# Patient Record
Sex: Male | Born: 1985 | Race: White | Hispanic: No | Smoking: Never smoker
Health system: Southern US, Community
[De-identification: ages and names within clinical notes are randomized; demographics above are authoritative.]

## PROBLEM LIST (undated history)

## (undated) ENCOUNTER — Emergency Department (HOSPITAL_COMMUNITY): Admission: EM | Discharge status: Absent without leave | Payer: Self-pay | Source: Home / Self Care

## (undated) DIAGNOSIS — K649 Unspecified hemorrhoids: Secondary | ICD-10-CM

## (undated) HISTORY — PX: AMPUTATION: SHX166

---

## 2009-02-20 ENCOUNTER — Emergency Department (HOSPITAL_COMMUNITY): Admission: EM | Admit: 2009-02-20 | Discharge: 2009-02-20 | Payer: Self-pay | Admitting: Emergency Medicine

## 2009-07-27 ENCOUNTER — Emergency Department (HOSPITAL_COMMUNITY): Admission: EM | Admit: 2009-07-27 | Discharge: 2009-07-27 | Payer: Self-pay | Admitting: Emergency Medicine

## 2010-03-17 ENCOUNTER — Emergency Department (HOSPITAL_COMMUNITY): Admission: EM | Admit: 2010-03-17 | Discharge: 2010-03-17 | Payer: Self-pay | Admitting: Emergency Medicine

## 2011-03-22 LAB — URINALYSIS, ROUTINE W REFLEX MICROSCOPIC
Bilirubin Urine: NEGATIVE
Glucose, UA: NEGATIVE mg/dL
Ketones, ur: NEGATIVE mg/dL
Nitrite: NEGATIVE
Protein, ur: NEGATIVE mg/dL
pH: 6.5 (ref 5.0–8.0)

## 2011-04-13 LAB — STREP A DNA PROBE: Group A Strep Probe: NEGATIVE

## 2012-07-19 ENCOUNTER — Encounter (HOSPITAL_COMMUNITY): Payer: Self-pay | Admitting: *Deleted

## 2012-07-19 ENCOUNTER — Emergency Department (HOSPITAL_COMMUNITY)
Admission: EM | Admit: 2012-07-19 | Discharge: 2012-07-19 | Disposition: A | Discharge status: Home / Self Care | Payer: Medicaid Other | Attending: Emergency Medicine | Admitting: Emergency Medicine

## 2012-07-19 DIAGNOSIS — T63461A Toxic effect of venom of wasps, accidental (unintentional), initial encounter: Secondary | ICD-10-CM | POA: Insufficient documentation

## 2012-07-19 DIAGNOSIS — T7840XA Allergy, unspecified, initial encounter: Secondary | ICD-10-CM

## 2012-07-19 DIAGNOSIS — T6391XA Toxic effect of contact with unspecified venomous animal, accidental (unintentional), initial encounter: Secondary | ICD-10-CM | POA: Insufficient documentation

## 2012-07-19 MED ORDER — EPINEPHRINE 0.3 MG/0.3ML IJ DEVI
0.3000 mg | Freq: Once | INTRAMUSCULAR | Status: AC
  Administered 2012-07-19: 0.3 mg via INTRAMUSCULAR
  Filled 2012-07-19: qty 0.3

## 2012-07-19 MED ORDER — PREDNISONE 50 MG PO TABS: ORAL_TABLET | ORAL | Status: AC

## 2012-07-19 MED ORDER — METHYLPREDNISOLONE SODIUM SUCC 125 MG IJ SOLR
125.0000 mg | Freq: Once | INTRAMUSCULAR | Status: AC
  Administered 2012-07-19: 125 mg via INTRAVENOUS
  Filled 2012-07-19: qty 2

## 2012-07-19 MED ORDER — DIPHENHYDRAMINE HCL 50 MG/ML IJ SOLN
25.0000 mg | Freq: Once | INTRAMUSCULAR | Status: AC
  Administered 2012-07-19: 25 mg via INTRAVENOUS
  Filled 2012-07-19: qty 1

## 2012-07-19 MED ORDER — FAMOTIDINE IN NACL 20-0.9 MG/50ML-% IV SOLN
20.0000 mg | Freq: Once | INTRAVENOUS | Status: AC
  Administered 2012-07-19: 20 mg via INTRAVENOUS
  Filled 2012-07-19: qty 50

## 2012-07-19 MED ORDER — EPINEPHRINE 0.3 MG/0.3ML IJ DEVI: 0.3000 mg | INTRAMUSCULAR | Status: DC | PRN

## 2012-07-19 NOTE — ED Notes (Signed)
Swelling noted to face however no swelling to tongue or airway. Denies SOB or chest pain

## 2012-07-19 NOTE — ED Notes (Signed)
Facial swelling decreased no swelling noted to tongue. Still denies CP or SOB

## 2012-07-19 NOTE — ED Provider Notes (Signed)
History  This chart was scribed for Joya Gaskins, MD by Gerlean Ren. This patient was seen in room [facility]16A/[facility]16A and the patient's care was started at 1:17PM.  [name]: [id number]  Arrival date & time 07/19/12  1304   First MD Initiated Contact with Patient 07/19/12 1317      Chief Complaint  Patient presents with  . Insect Bite     Patient is a 26 y.o. male presenting with allergic reaction. The history is provided by the patient. No language interpreter was used.  Allergic Reaction The primary symptoms do not include shortness of breath, vomiting or diarrhea. The current episode started less than 1 hour ago. The problem has been gradually worsening. This is a new problem.  The onset of the reaction was associated with insect bite/sting. Significant symptoms also include itching. Significant symptoms that are not present include eye redness.    Brad Galvan is a 26 y.o. male who presents to the Emergency Department complaining of an allegic reaction to a wasp sting to face with associated rapid onset, gradually worsening, constant face swelling.  Pt also reports mild itching to right arm that has diminished since arrival.  Pt took 50mg  Benadryll with no improvements to symptoms.  Pt has no h/o severe allergies to stings.  Pt reports nose congestion, but no problems with throat or tongue swelling.  Pt denies syncope, fever, neck pain, sore throat, visual disturbance, CP, cough, SOB, abdominal pain, nausea, emesis, diarrhea, urinary symptoms, back pain, HA, weakness, numbness and rash as associated symptoms. He does not have a h/o chronic medical conditions.  Pt denies tobacco and alcohol use.     PMH - none  History reviewed. No pertinent past surgical history.  History reviewed. No pertinent family history.  History  Substance Use Topics  . Smoking status: Never Smoker   . Smokeless tobacco: Not on file  . Alcohol Use: No      Review of Systems  HENT: Positive for facial  swelling. Negative for trouble swallowing.   Eyes: Negative for redness.  Respiratory: Negative for shortness of breath.   Cardiovascular: Negative for chest pain.  Gastrointestinal: Negative for vomiting and diarrhea.  Skin: Positive for itching.  Neurological: Negative for syncope.  All other systems reviewed and are negative.    Allergies  Review of patient's allergies indicates no known allergies.  Home Medications  No current outpatient prescriptions on file.  BP 125/76  Pulse 66  Temp 98.3 F (36.8 C) (Oral)  Ht 5\' 6"  (1.676 m)  Wt 190 lb (86.183 kg)  BMI 30.67 kg/m2  SpO2 98% BP 109/68  Pulse 66  Temp 98.3 F (36.8 C) (Oral)  Resp 18  Ht 5\' 6"  (1.676 m)  Wt 190 lb (86.183 kg)  BMI 30.67 kg/m2  SpO2 98%   Physical Exam  Nursing note and vitals reviewed.  CONSTITUTIONAL: Well developed/well nourished HEAD AND FACE: Normocephalic/atraumatic, periorbital swelling. EYES: EOMI/PERRL ENMT: Mucous membranes moist, No tongue swelling, Uvula midline. NECK: supple no meningeal signs CV: S1/S2 noted, no murmurs/rubs/gallops noted LUNGS: Lungs are clear to auscultation bilaterally, no apparent distress ABDOMEN: soft, nontender, no rebound or guarding, Urticaria present  NEURO: Pt is awake/alert, moves all extremitiesx4 EXTREMITIES: pulses normal, full ROM SKIN: warm, color normal PSYCH: no abnormalities of mood noted  ED Course  Procedures   DIAGNOSTIC STUDIES: Oxygen Saturation is 98% on room air, normal by my interpretation.    COORDINATION OF CARE: 1:22PM- Informed pt that severity of allergic reaction  requires several hours of observation and medication.   2:09PM- Pt re-checked and reports feeling better after epinephrine treatment.   Pt much improved after 3hrs of monitoring He is requesting discharge I advised him of potential of rebound reaction, and when this happens to use epipen that is prescribed and return to ER Pt agreeable     MDM    Nursing notes including past medical history and social history reviewed and considered in documentation   I personally performed the services described in this documentation, which was scribed in my presence. The recorded information has been reviewed and considered.          Joya Gaskins, MD 07/19/12 2035

## 2012-07-19 NOTE — ED Notes (Signed)
Bee sting to face,  Lg am swelling, with hives.

## 2012-08-16 ENCOUNTER — Emergency Department (HOSPITAL_COMMUNITY)
Admission: EM | Admit: 2012-08-16 | Discharge: 2012-08-16 | Disposition: A | Discharge status: Home / Self Care | Payer: Medicaid Other | Attending: Emergency Medicine | Admitting: Emergency Medicine

## 2012-08-16 ENCOUNTER — Encounter (HOSPITAL_COMMUNITY): Payer: Self-pay | Admitting: *Deleted

## 2012-08-16 DIAGNOSIS — T6391XA Toxic effect of contact with unspecified venomous animal, accidental (unintentional), initial encounter: Secondary | ICD-10-CM | POA: Insufficient documentation

## 2012-08-16 DIAGNOSIS — T63481A Toxic effect of venom of other arthropod, accidental (unintentional), initial encounter: Secondary | ICD-10-CM

## 2012-08-16 DIAGNOSIS — T63461A Toxic effect of venom of wasps, accidental (unintentional), initial encounter: Secondary | ICD-10-CM | POA: Insufficient documentation

## 2012-08-16 MED ORDER — PREDNISONE 20 MG PO TABS
60.0000 mg | ORAL_TABLET | Freq: Once | ORAL | Status: AC
  Administered 2012-08-16: 60 mg via ORAL
  Filled 2012-08-16: qty 3

## 2012-08-16 MED ORDER — PREDNISONE 20 MG PO TABS: 40.0000 mg | ORAL_TABLET | Freq: Every day | ORAL | Status: AC

## 2012-08-16 MED ORDER — FAMOTIDINE 20 MG PO TABS
40.0000 mg | ORAL_TABLET | Freq: Once | ORAL | Status: AC
  Administered 2012-08-16: 40 mg via ORAL
  Filled 2012-08-16: qty 2

## 2012-08-16 MED ORDER — EPINEPHRINE 0.3 MG/0.3ML IJ DEVI: 0.3000 mg | INTRAMUSCULAR | Status: DC | PRN

## 2012-08-16 NOTE — ED Notes (Signed)
Bee sting to scalp, used epipen prior to arrival

## 2012-08-16 NOTE — ED Provider Notes (Signed)
History     [name]: [id number]  Arrival date & time 08/16/12  1730   First MD Initiated Contact with Patient 08/16/12 1744      Chief Complaint  Patient presents with  . Allergic Reaction     HPI Pt was seen at 1755.  Per pt, c/o sudden onset and resolution of one episodes of bee sting to his left scalp approx 1630 PTA.  Pt states he has a hx of allergic reaction to same, and used his epi pen as well as took benadryl after being stung.  Denies diffuse rash, no dysphagia, no intra-oral edema, no CP/palpitations, no SOB/wheezing.    History reviewed. No pertinent past medical history.  History reviewed. No pertinent past surgical history.   History  Substance Use Topics  . Smoking status: Never Smoker   . Smokeless tobacco: Not on file  . Alcohol Use: No      Review of Systems ROS: Statement: All systems negative except as marked or noted in the HPI; Constitutional: Negative for fever and chills. ; ; Eyes: Negative for eye pain, redness and discharge. ; ; ENMT: Negative for ear pain, hoarseness, nasal congestion, sinus pressure and sore throat. ; ; Cardiovascular: Negative for chest pain, palpitations, diaphoresis, dyspnea and peripheral edema. ; ; Respiratory: Negative for cough, wheezing and stridor. ; ; Gastrointestinal: Negative for nausea, vomiting, diarrhea, abdominal pain, blood in stool, hematemesis, jaundice and rectal bleeding. . ; ; Genitourinary: Negative for dysuria, flank pain and hematuria. ; ; Musculoskeletal: Negative for back pain and neck pain. Negative for swelling and trauma.; ; Skin: +bee sting. Negative for pruritus, rash, abrasions, blisters, bruising and skin lesion.; ; Neuro: Negative for headache, lightheadedness and neck stiffness. Negative for weakness, altered level of consciousness , altered mental status, extremity weakness, paresthesias, involuntary movement, seizure and syncope.     Allergies  Bee venom  Home Medications   Current Outpatient Rx    Name Route Sig Dispense Refill  . DIPHENHYDRAMINE HCL 25 MG PO TABS Oral Take 50 mg by mouth once as needed. For allergic reaction    . EPINEPHRINE 0.3 MG/0.3ML IJ DEVI Intramuscular Inject 0.3 mLs (0.3 mg total) into the muscle as needed. 2 Device 0    BP 127/76  Pulse 81  Temp 98.1 F (36.7 C) (Oral)  Resp 18  Ht 5\' 6"  (1.676 m)  Wt 189 lb (85.73 kg)  BMI 30.51 kg/m2  SpO2 97%  Physical Exam 1800: Physical examination:  Nursing notes reviewed; Vital signs and O2 SAT reviewed;  Constitutional: Well developed, Well nourished, Well hydrated, In no acute distress; Head:  Normocephalic, atraumatic; Eyes: EOMI, PERRL, No scleral icterus; ENMT: No intra-oral edema, no hoarse voice, no drooling, no stridor. Mouth and pharynx normal, Mucous membranes moist; Neck: Supple, Full range of motion, No lymphadenopathy; Cardiovascular: Regular rate and rhythm, No murmur, rub, or gallop; Respiratory: Breath sounds clear & equal bilaterally, No rales, rhonchi, wheezes.  Speaking full sentences with ease, Normal respiratory effort/excursion; Chest: Nontender, Movement normal; Abdomen: Soft, Nontender, Nondistended, Normal bowel sounds;; Extremities: Pulses normal, No tenderness, No edema, No calf edema or asymmetry.; Neuro: AA&Ox3, Major CN grossly intact.  Speech clear. No gross focal motor or sensory deficits in extremities.; Skin: Color normal, Warm, Dry, no rash.    ED Course  Procedures    MDM  MDM Reviewed: nursing note, previous chart and vitals     2005:  States he continues to feel "fine" while in the ED, approx 4 hours s/p  bee sting/meds.  Wants to go home now, requesting another epi pen rx.  Dx d/w pt and family.  Questions answered.  Verb understanding, agreeable to d/c home with outpt f/u.        Laray Anger, DO 08/17/12 2036

## 2012-08-30 ENCOUNTER — Encounter (HOSPITAL_COMMUNITY): Payer: Self-pay

## 2012-08-30 ENCOUNTER — Emergency Department (HOSPITAL_COMMUNITY): Payer: Medicaid Other

## 2012-08-30 ENCOUNTER — Emergency Department (HOSPITAL_COMMUNITY)
Admission: EM | Admit: 2012-08-30 | Discharge: 2012-08-30 | Disposition: A | Discharge status: Home / Self Care | Payer: Medicaid Other | Attending: Emergency Medicine | Admitting: Emergency Medicine

## 2012-08-30 DIAGNOSIS — S62309A Unspecified fracture of unspecified metacarpal bone, initial encounter for closed fracture: Secondary | ICD-10-CM | POA: Insufficient documentation

## 2012-08-30 DIAGNOSIS — S62304A Unspecified fracture of fourth metacarpal bone, right hand, initial encounter for closed fracture: Secondary | ICD-10-CM

## 2012-08-30 DIAGNOSIS — R0789 Other chest pain: Secondary | ICD-10-CM

## 2012-08-30 DIAGNOSIS — R071 Chest pain on breathing: Secondary | ICD-10-CM | POA: Insufficient documentation

## 2012-08-30 MED ORDER — NAPROXEN 250 MG PO TABS: 250.0000 mg | ORAL_TABLET | Freq: Two times a day (BID) | ORAL | Status: DC

## 2012-08-30 MED ORDER — OXYCODONE-ACETAMINOPHEN 5-325 MG PO TABS: ORAL_TABLET | ORAL | Status: DC

## 2012-08-30 NOTE — ED Notes (Signed)
MD at bedside. 
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
## 2012-09-04 ENCOUNTER — Encounter (HOSPITAL_COMMUNITY): Payer: Self-pay | Admitting: Pharmacy Technician

## 2012-09-04 NOTE — Patient Instructions (Addendum)
Your procedure is scheduled on:  09/06/2012  Report to [facility] at 10:45     AM.  Call this number if you have problems the morning of surgery: [phone]   Remember:   Do not drink or eat food:After Midnight.    Clear liquids include soda, tea, black coffee, apple or grape juice, broth.  Take these medicines the morning of surgery with A SIP OF WATER:    Do not wear jewelry, make-up or nail polish.  Do not wear lotions, powders, or perfumes. You may wear deodorant.  Do not shave 48 hours prior to surgery.  Do not bring valuables to the hospital.  Contacts, dentures or bridgework may not be worn into surgery.  Leave suitcase in the car. After surgery it may be brought to your room.  For patients admitted to the hospital, checkout time is 11:00 AM the day of discharge.   Patients discharged the day of surgery will not be allowed to drive home.  Name and phone number of your driver:   Special Instructions: CHG Shower use special wash: 1/2 bottle night before surgery and 1/2 bottle morning of surgery.   Please read over the following fact sheets that you were given: Pain Booklet, MRSA  Surgical Site Infection Prevention, Anesthesia Post-op Instructions and Care and Recovery After Surgery  Open Reduction, Internal Fixation (ORIF), Generic Usually, if bones are broken (fractured) and are out of place, unstable, or may become out of place, surgery is needed. This surgery is called an open reduction and internal fixation (ORIF). Open reduction means that the area of the fracture is opened up so the surgeon can see it. Internal fixation means that screws, pins, or fixation devices are used to hold the bone pieces in place. LET YOUR CAREGIVER KNOW ABOUT:   Allergies.   Medicines taken, including herbs, eyedrops, over-the-counter medicines, and creams.   Use of steroids (by mouth or creams).   Previous problems with anesthetics or numbing medicines.   History of bleeding or blood problems.     History of blood clots.   Possibility of pregnancy, if this applies.   Previous surgery.   Other health problems.  RISKS AND COMPLICATIONS  All surgery is associated with risks. Some of these risks are:  Excessive bleeding.   Infection.   Imperfect results with loss of joint function.  BEFORE THE PROCEDURE  Usually, surgery is performed shortly after the injury. It is important to provide information to your caregiver after your injury. AFTER THE PROCEDURE  After surgery, you will be taken to a recovery area where a nurse will monitor your progress. You may have a long, narrow tube(catheter) in the bladder following surgery that helps you pass your water. When awake, stable, taking fluids well, and without complications, you will be returned to your room. You will receive physical therapy and other care. Physical therapy is done until you are doing well and your caregiver feels it is safe for you to go home or to an extended care facility. Following surgery, the bones may be protected with a cast. The type of casting depends on where the fracture was. Casts are generally left in place for about 5 to 6 weeks. During this time, your caregiver will follow your progress. X-rays may be taken during healing to make sure the bones stay in place. HOME CARE INSTRUCTIONS   You or your child may resume normal diet and activities as directed or allowed.   Put ice on the injured area.  Put ice in a plastic bag.   Place a towel between the skin and the bag.   Leave the ice on for 15 to 20 minutes at a time, 3 to 4 times a day, for the first 2 days following surgery.   Change bandages (dressings) if necessary or as directed.   If given a plaster or fiberglass cast:   Do not try to scratch the skin under the cast using sharp or pointed objects.   Check the skin around the cast every day. You may put lotion on any red or sore areas.   Keep the cast dry and clean.   Do not put pressure  on any part of the cast or splint until it is fully hardened.   The cast or splint can be protected during bathing with a plastic bag. Do not lower the cast or splint into water.   Only take over-the-counter or prescription medicines for pain, discomfort, or fever as directed by your caregiver.   Use crutches as directed and do not exercise the leg unless instructed.   If the bones get out of position (displaced), it may eventually lead to arthritis and lasting disability. Problems can follow even the best of care. Follow the directions of your caregiver.   Follow all instructions given by your caregiver, make and keep follow-up appointments, and use crutches as directed.  SEEK IMMEDIATE MEDICAL CARE IF:   There is redness, swelling, numbness, or increasing pain in the wound.   There is pus coming from the wound.   You or your child has an oral temperature above 102 F (38.9 C), not controlled by medicine.   A bad smell is coming from the wound or dressing.   The wound breaks open (edges not staying together) after stitches (sutures) or staples have been removed.   The skin or nails below the injury turn blue or gray, or feel cold or numb.   There is severe pain under the cast or in the foot.  If there is not a window in the cast for observing the wound, a discharge or minor bleeding may show up as a stain on the outside of the cast. Report these findings to your caregiver. MAKE SURE YOU:   Understand these instructions.   Will watch your condition.   Will get help right away if you are not doing well or gets worse.  Document Released: 12/24/2006 Document Revised: 12/02/2011 Document Reviewed: 12/01/2007 [facility] Patient Information 2012 [place], [place]. Instructions Following General Anesthetic, Adult A nurse specialized in giving anesthesia (anesthetist) or a doctor specialized in giving anesthesia (anesthesiologist) gave you a medicine that made you sleep while a procedure  was performed. For as long as 24 hours following this procedure, you may feel:  Dizzy.   Weak.   Drowsy.  AFTER THE PROCEDURE After surgery, you will be taken to the recovery area where a nurse will monitor your progress. You will be allowed to go home when you are awake, stable, taking fluids well, and without complications. For the first 24 hours following an anesthetic:  Have a responsible person with you.   Do not drive a car. If you are alone, do not take public transportation.   Do not drink alcohol.   Do not take medicine that has not been prescribed by your caregiver.   Do not sign important papers or make important decisions.   You may resume normal diet and activities as directed.   Change bandages (dressings) as directed.  Only take over-the-counter or prescription medicines for pain, discomfort, or fever as directed by your caregiver.  If you have questions or problems that seem related to the anesthetic, call the hospital and ask for the anesthetist or anesthesiologist on call. SEEK IMMEDIATE MEDICAL CARE IF:   You develop a rash.   You have difficulty breathing.   You have chest pain.   You develop any allergic problems.  Document Released: 03/21/2001 Document Revised: 12/02/2011 Document Reviewed: 10/30/2007 [facility] Patient Information 2012 [place], [place].

## 2012-09-05 ENCOUNTER — Encounter (HOSPITAL_COMMUNITY)
Admission: RE | Admit: 2012-09-05 | Discharge: 2012-09-05 | Disposition: A | Discharge status: Home / Self Care | Payer: Medicaid Other | Source: Ambulatory Visit | Attending: Orthopaedic Surgery | Admitting: Orthopaedic Surgery

## 2012-09-05 ENCOUNTER — Encounter (HOSPITAL_COMMUNITY): Payer: Self-pay | Admitting: Orthopaedic Surgery

## 2012-09-05 ENCOUNTER — Encounter (HOSPITAL_COMMUNITY): Payer: Self-pay

## 2012-09-05 LAB — SURGICAL PCR SCREEN
MRSA, PCR: NEGATIVE
Staphylococcus aureus: NEGATIVE

## 2012-09-05 LAB — URINALYSIS, ROUTINE W REFLEX MICROSCOPIC
Hgb urine dipstick: NEGATIVE
Leukocytes, UA: NEGATIVE
Nitrite: NEGATIVE
Specific Gravity, Urine: >1.03 — ABNORMAL HIGH (ref 1.005–1.030)
Urobilinogen, UA: 0.2 mg/dL (ref 0.0–1.0)

## 2012-09-05 LAB — COMPREHENSIVE METABOLIC PANEL
AST: 19 U/L (ref 0–37)
Albumin: 4 g/dL (ref 3.5–5.2)
CO2: 30 mEq/L (ref 19–32)
Calcium: 9.7 mg/dL (ref 8.4–10.5)
Creatinine, Ser: 0.86 mg/dL (ref 0.50–1.35)
GFR calc non Af Amer: >90 mL/min (ref >90)

## 2012-09-05 LAB — CBC
Platelets: 227 10*3/uL (ref 150–400)
RBC: 5 MIL/uL (ref 4.22–5.81)
WBC: 5.2 10*3/uL (ref 4.0–10.5)

## 2012-09-05 LAB — DIFFERENTIAL
Basophils Absolute: 0 10*3/uL (ref 0.0–0.1)
Basophils Relative: 0 % (ref 0–1)
Eosinophils Relative: 2 % (ref 0–5)
Monocytes Absolute: 0.7 10*3/uL (ref 0.1–1.0)

## 2012-09-05 NOTE — Progress Notes (Signed)
Office notified of need of H& P. Brad Galvan stated that he was working on it.
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
## 2012-09-06 ENCOUNTER — Encounter (HOSPITAL_COMMUNITY)
Admission: RE | Disposition: A | Discharge status: Home / Self Care | Payer: Self-pay | Source: Ambulatory Visit | Attending: Orthopaedic Surgery

## 2012-09-06 ENCOUNTER — Observation Stay (HOSPITAL_COMMUNITY)
Admission: RE | Admit: 2012-09-06 | Discharge: 2012-09-07 | Disposition: A | Discharge status: Home / Self Care | Payer: Medicaid Other | Source: Ambulatory Visit | Attending: Orthopaedic Surgery | Admitting: Orthopaedic Surgery

## 2012-09-06 ENCOUNTER — Ambulatory Visit (HOSPITAL_COMMUNITY): Payer: Medicaid Other

## 2012-09-06 ENCOUNTER — Encounter (HOSPITAL_COMMUNITY): Payer: Self-pay | Admitting: *Deleted

## 2012-09-06 ENCOUNTER — Ambulatory Visit (HOSPITAL_COMMUNITY): Payer: Medicaid Other | Admitting: Anesthesiology

## 2012-09-06 ENCOUNTER — Encounter (HOSPITAL_COMMUNITY): Payer: Self-pay | Admitting: Anesthesiology

## 2012-09-06 DIAGNOSIS — S62309A Unspecified fracture of unspecified metacarpal bone, initial encounter for closed fracture: Principal | ICD-10-CM | POA: Insufficient documentation

## 2012-09-06 DIAGNOSIS — S62304A Unspecified fracture of fourth metacarpal bone, right hand, initial encounter for closed fracture: Secondary | ICD-10-CM

## 2012-09-06 HISTORY — PX: ORIF FINGER FRACTURE: SHX2122

## 2012-09-06 SURGERY — OPEN REDUCTION INTERNAL FIXATION (ORIF) METACARPAL (FINGER) FRACTURE
Anesthesia: General | Site: Hand | Laterality: Right | Wound class: Clean

## 2012-09-06 MED ORDER — MIDAZOLAM HCL 2 MG/2ML IJ SOLN
INTRAMUSCULAR | Status: AC
  Filled 2012-09-06: qty 2

## 2012-09-06 MED ORDER — LIDOCAINE HCL (CARDIAC) 10 MG/ML IV SOLN
INTRAVENOUS | Status: DC | PRN
  Administered 2012-09-06: 50 mg via INTRAVENOUS

## 2012-09-06 MED ORDER — CEFAZOLIN SODIUM 1-5 GM-% IV SOLN
INTRAVENOUS | Status: DC | PRN
  Administered 2012-09-06: 2 g via INTRAVENOUS

## 2012-09-06 MED ORDER — FENTANYL CITRATE 0.05 MG/ML IJ SOLN
INTRAMUSCULAR | Status: AC
  Filled 2012-09-06: qty 2

## 2012-09-06 MED ORDER — ONDANSETRON HCL 4 MG/2ML IJ SOLN
4.0000 mg | Freq: Once | INTRAMUSCULAR | Status: AC
  Administered 2012-09-06: 4 mg via INTRAVENOUS

## 2012-09-06 MED ORDER — MIDAZOLAM HCL 2 MG/2ML IJ SOLN
1.0000 mg | INTRAMUSCULAR | Status: DC | PRN
  Administered 2012-09-06 (×2): 2 mg via INTRAVENOUS

## 2012-09-06 MED ORDER — FENTANYL CITRATE 0.05 MG/ML IJ SOLN
INTRAMUSCULAR | Status: DC | PRN
  Administered 2012-09-06 (×4): 25 ug via INTRAVENOUS
  Administered 2012-09-06 (×3): 50 ug via INTRAVENOUS
  Administered 2012-09-06 (×2): 25 ug via INTRAVENOUS

## 2012-09-06 MED ORDER — ONDANSETRON HCL 4 MG/2ML IJ SOLN
INTRAMUSCULAR | Status: AC
  Filled 2012-09-06: qty 2

## 2012-09-06 MED ORDER — INFLUENZA VIRUS VACC SPLIT PF IM SUSP
0.5000 mL | INTRAMUSCULAR | Status: AC
  Administered 2012-09-07: 0.5 mL via INTRAMUSCULAR
  Filled 2012-09-06: qty 0.5

## 2012-09-06 MED ORDER — PROMETHAZINE HCL 25 MG/ML IJ SOLN
12.5000 mg | INTRAMUSCULAR | Status: DC | PRN
  Administered 2012-09-06: 12.5 mg via INTRAMUSCULAR
  Filled 2012-09-06 (×2): qty 1

## 2012-09-06 MED ORDER — ZOLPIDEM TARTRATE 5 MG PO TABS: 5.0000 mg | ORAL_TABLET | Freq: Every day | ORAL | Status: DC

## 2012-09-06 MED ORDER — PROPOFOL 10 MG/ML IV EMUL
INTRAVENOUS | Status: AC
  Filled 2012-09-06: qty 20

## 2012-09-06 MED ORDER — LACTATED RINGERS IV SOLN
INTRAVENOUS | Status: DC | PRN
  Administered 2012-09-06 (×2): via INTRAVENOUS

## 2012-09-06 MED ORDER — LACTATED RINGERS IV SOLN
INTRAVENOUS | Status: DC
  Administered 2012-09-06: 1000 mL via INTRAVENOUS

## 2012-09-06 MED ORDER — PROPOFOL 10 MG/ML IV BOLUS
INTRAVENOUS | Status: DC | PRN
  Administered 2012-09-06: 300 mg via INTRAVENOUS

## 2012-09-06 MED ORDER — SODIUM CHLORIDE 0.9 % IV SOLN
INTRAVENOUS | Status: DC
  Administered 2012-09-06: 16:00:00 via INTRAVENOUS

## 2012-09-06 MED ORDER — SODIUM CHLORIDE 0.9 % IJ SOLN
INTRAMUSCULAR | Status: AC
  Filled 2012-09-06: qty 3

## 2012-09-06 MED ORDER — CEFAZOLIN SODIUM-DEXTROSE 2-3 GM-% IV SOLR: 2.0000 g | Freq: Once | INTRAVENOUS | Status: DC

## 2012-09-06 MED ORDER — FENTANYL CITRATE 0.05 MG/ML IJ SOLN
25.0000 ug | INTRAMUSCULAR | Status: DC | PRN
  Administered 2012-09-06 (×3): 50 ug via INTRAVENOUS

## 2012-09-06 MED ORDER — ONDANSETRON HCL 4 MG/2ML IJ SOLN: 4.0000 mg | Freq: Once | INTRAMUSCULAR | Status: DC | PRN

## 2012-09-06 MED ORDER — LIDOCAINE HCL (PF) 1 % IJ SOLN
INTRAMUSCULAR | Status: AC
  Filled 2012-09-06: qty 5

## 2012-09-06 MED ORDER — MORPHINE SULFATE 4 MG/ML IJ SOLN
4.0000 mg | INTRAMUSCULAR | Status: DC | PRN
  Administered 2012-09-06 – 2012-09-07 (×3): 4 mg via INTRAVENOUS
  Filled 2012-09-06 (×3): qty 1

## 2012-09-06 MED ORDER — 0.9 % SODIUM CHLORIDE (POUR BTL) OPTIME
TOPICAL | Status: DC | PRN
  Administered 2012-09-06: 1000 mL

## 2012-09-06 MED ORDER — CEFAZOLIN SODIUM-DEXTROSE 2-3 GM-% IV SOLR
INTRAVENOUS | Status: AC
  Filled 2012-09-06: qty 50

## 2012-09-06 MED ORDER — HYDROCODONE-ACETAMINOPHEN 7.5-325 MG PO TABS
1.0000 | ORAL_TABLET | ORAL | Status: DC | PRN
  Administered 2012-09-07: 1 via ORAL
  Filled 2012-09-06 (×2): qty 1

## 2012-09-06 MED ORDER — OXYCODONE-ACETAMINOPHEN 5-325 MG PO TABS: 1.0000 | ORAL_TABLET | ORAL | Status: DC | PRN

## 2012-09-06 SURGICAL SUPPLY — 53 items
BAG HAMPER (MISCELLANEOUS) ×2 IMPLANT
BANDAGE ELASTIC 3 VELCRO NS (GAUZE/BANDAGES/DRESSINGS) ×2 IMPLANT
BANDAGE ESMARK 4X12 BL STRL LF (DISPOSABLE) ×2 IMPLANT
BIT DRILL 1.5 (BIT) ×1
BIT DRILL 1.5MM (BIT) ×1 IMPLANT
BNDG COHESIVE 4X5 TAN NS LF (GAUZE/BANDAGES/DRESSINGS) IMPLANT
BNDG ESMARK 4X12 BLUE STRL LF (DISPOSABLE) ×4
CLOTH BEACON ORANGE TIMEOUT ST (SAFETY) ×2 IMPLANT
COVER LIGHT HANDLE STERIS (MISCELLANEOUS) ×4 IMPLANT
COVER MAYO STAND XLG (DRAPE) ×2 IMPLANT
CUFF TOURNIQUET SINGLE 18IN (TOURNIQUET CUFF) ×2 IMPLANT
CUFF TOURNIQUET SINGLE 34IN LL (TOURNIQUET CUFF) IMPLANT
CUFF TOURNIQUET SINGLE 44IN (TOURNIQUET CUFF) IMPLANT
DRAPE C-ARM FOLDED MOBILE STRL (DRAPES) ×2 IMPLANT
DRILL BIT 1.5MM (BIT) ×1
DURAPREP 26ML APPLICATOR (WOUND CARE) ×2 IMPLANT
GLOVE BIO SURGEON STRL SZ8 (GLOVE) ×2 IMPLANT
GLOVE BIO SURGEON STRL SZ8.5 (GLOVE) ×2 IMPLANT
GOWN STRL REIN XL XLG (GOWN DISPOSABLE) ×8 IMPLANT
KIT ROOM TURNOVER APOR (KITS) ×2 IMPLANT
MANIFOLD NEPTUNE II (INSTRUMENTS) ×2 IMPLANT
MARKER SKIN DUAL TIP RULER LAB (MISCELLANEOUS) ×2 IMPLANT
NS IRRIG 1000ML POUR BTL (IV SOLUTION) ×2 IMPLANT
PACK BASIC LIMB (CUSTOM PROCEDURE TRAY) ×2 IMPLANT
PAD ARMBOARD 7.5X6 YLW CONV (MISCELLANEOUS) ×2 IMPLANT
PAD CAST 4YDX4 CTTN HI CHSV (CAST SUPPLIES) ×1 IMPLANT
PADDING CAST COTTON 4X4 STRL (CAST SUPPLIES) ×1
PLATE 4 HOLE LC DCP (Plate) ×2 IMPLANT
PLATE 6 HOLE LC DCP (Plate) ×2 IMPLANT
SCREW CORT TI ST 2.0X10 (Screw) ×4 IMPLANT
SCREW CORT TI ST 2.0X11 (Screw) ×2 IMPLANT
SCREW CORT TI ST 2.0X12 (Screw) ×6 IMPLANT
SCREW CORT TI ST 2.0X14 (Screw) ×4 IMPLANT
SET BASIN LINEN APH (SET/KITS/TRAYS/PACK) ×2 IMPLANT
SOL PREP PROV IODINE SCRUB 4OZ (MISCELLANEOUS) IMPLANT
SPLINT IMMOBILIZER J 3INX20FT (CAST SUPPLIES)
SPLINT J IMMOBILIZER 3X20FT (CAST SUPPLIES) IMPLANT
SPLINT J IMMOBILIZER 4X20FT (CAST SUPPLIES) ×1 IMPLANT
SPLINT J PLASTER J 4INX20Y (CAST SUPPLIES) ×1
SPONGE GAUZE 4X4 12PLY (GAUZE/BANDAGES/DRESSINGS) ×2 IMPLANT
STAPLER VISISTAT (STAPLE) ×2 IMPLANT
SUT CHROMIC 0 CTX 36 (SUTURE) IMPLANT
SUT CHROMIC VP 2 (SUTURE) ×2 IMPLANT
SUT ETHILON 3 0 FSL (SUTURE) IMPLANT
SUT MON AB 0 CT1 (SUTURE) IMPLANT
SUT MON AB 2-0 SH 27 (SUTURE)
SUT MON AB 2-0 SH27 (SUTURE) IMPLANT
SUT NUROLON CT 2 BLK #1 18IN (SUTURE) IMPLANT
SUT PLAIN 2 0 XLH (SUTURE) ×2 IMPLANT
SUT PLAIN 3 0 FS2 (SUTURE) IMPLANT
SUT SILK 0 FSL (SUTURE) IMPLANT
SYR BULB IRRIGATION 50ML (SYRINGE) ×2 IMPLANT
YANKAUER SUCT 12FT TUBE ARGYLE (SUCTIONS) IMPLANT

## 2012-09-06 NOTE — Anesthesia Procedure Notes (Signed)
Procedure Name: LMA Insertion Date/Time: 09/06/2012 12:29 PM Performed by: Carolyne Littles, AMY L Pre-anesthesia Checklist: Patient identified, Timeout performed, Emergency Drugs available, Suction available and Patient being monitored Patient Re-evaluated:Patient Re-evaluated prior to inductionOxygen Delivery Method: Circle system utilized Preoxygenation: Pre-oxygenation with 100% oxygen Intubation Type: IV induction Ventilation: Mask ventilation without difficulty LMA: LMA inserted LMA Size: 4.0 Number of attempts: 1 Tube secured with: Tape Dental Injury: Teeth and Oropharynx as per pre-operative assessment

## 2012-09-06 NOTE — Transfer of Care (Signed)
Immediate Anesthesia Transfer of Care Note  Patient: Brad Galvan  Procedure(s) Performed: Procedure(s) (LRB) with comments: OPEN REDUCTION INTERNAL FIXATION (ORIF) METACARPAL (FINGER) FRACTURE (Right)  Patient Location: PACU  Anesthesia Type: General  Level of Consciousness: awake, alert , oriented and patient cooperative  Airway & Oxygen Therapy: Patient Spontanous Breathing and Patient connected to face mask oxygen  Post-op Assessment: Report given to PACU RN and Post -op Vital signs reviewed and stable  Post vital signs: Reviewed and stable  Complications: No apparent anesthesia complications

## 2012-09-06 NOTE — Progress Notes (Signed)
The History and Physical is unchanged. I have examined the patient. The patient is medically able to have surgery on the right hand . Brad Galvan

## 2012-09-06 NOTE — Brief Op Note (Signed)
09/06/2012  1:39 PM  PATIENT:  Brad Galvan  26 y.o. male  PRE-OPERATIVE DIAGNOSIS:  fracture right fourth metacarpal  POST-OPERATIVE DIAGNOSIS:  fracture right fourth metacarpal  PROCEDURE:  Procedure(s) (LRB) with comments: OPEN REDUCTION INTERNAL FIXATION (ORIF) METACARPAL (FINGER) FRACTURE (Right)  SURGEON:  Surgeon(s) and Role:    * Darreld Mclean, MD - Primary  PHYSICIAN ASSISTANT:   ASSISTANTS: [place]   ANESTHESIA:   general  EBL:  Total I/O In: 1000 [I.V.:1000] Out: 5 [Blood:5]  BLOOD ADMINISTERED:none  DRAINS: none   LOCAL MEDICATIONS USED:  NONE  SPECIMEN:  No Specimen  DISPOSITION OF SPECIMEN:  N/A  COUNTS:  YES  TOURNIQUET:   Total Tourniquet Time Documented: Upper Arm (Right) - 55 minutes  DICTATION: .Other Dictation: Dictation Number [id number]  PLAN OF CARE: Admit for overnight observation  PATIENT DISPOSITION:  PACU - hemodynamically stable.   Delay start of Pharmacological VTE agent (>24hrs) due to surgical blood loss or risk of bleeding: not applicable

## 2012-09-06 NOTE — Anesthesia Preprocedure Evaluation (Signed)
Anesthesia Evaluation  Patient identified by MRN, date of birth, ID band Patient awake    Reviewed: Allergy & Precautions, H&P , NPO status , Patient's Chart, lab work & pertinent test results  Airway Mallampati: I TM Distance: >3 FB Neck ROM: Full    Dental  (+) Teeth Intact   Pulmonary neg pulmonary ROS,  breath sounds clear to auscultation        Cardiovascular negative cardio ROS  Rhythm:Regular Rate:Normal     Neuro/Psych negative neurological ROS     GI/Hepatic negative GI ROS,   Endo/Other    Renal/GU      Musculoskeletal   Abdominal   Peds  Hematology   Anesthesia Other Findings   Reproductive/Obstetrics                           Anesthesia Physical Anesthesia Plan  ASA: I  Anesthesia Plan: General   Post-op Pain Management:    Induction: Intravenous  Airway Management Planned: LMA  Additional Equipment:   Intra-op Plan:   Post-operative Plan: Extubation in OR  Informed Consent: I have reviewed the patients History and Physical, chart, labs and discussed the procedure including the risks, benefits and alternatives for the proposed anesthesia with the patient or authorized representative who has indicated his/her understanding and acceptance.     Plan Discussed with:   Anesthesia Plan Comments:         Anesthesia Quick Evaluation

## 2012-09-06 NOTE — Preoperative (Signed)
Beta Blockers   Reason not to administer Beta Blockers:Not Applicable 

## 2012-09-06 NOTE — Anesthesia Postprocedure Evaluation (Signed)
  Anesthesia Post-op Note  Patient: Brad Galvan  Procedure(s) Performed: Procedure(s) (LRB) with comments: OPEN REDUCTION INTERNAL FIXATION (ORIF) METACARPAL (FINGER) FRACTURE (Right)  Patient Location: PACU  Anesthesia Type: General  Level of Consciousness: awake, alert , oriented and patient cooperative  Airway and Oxygen Therapy: Patient Spontanous Breathing and Patient connected to face mask oxygen  Post-op Pain: mild  Post-op Assessment: Post-op Vital signs reviewed, Patient's Cardiovascular Status Stable, Respiratory Function Stable, Patent Airway and No signs of Nausea or vomiting  Post-op Vital Signs: Reviewed and stable  Complications: No apparent anesthesia complications
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
## 2012-09-07 NOTE — Addendum Note (Signed)
Addendum  created 09/07/12 0800 by Steaven Wholey J Catlyn Shipton, CRNA   Modules edited:Notes Section    

## 2012-09-07 NOTE — Plan of Care (Signed)
Problem: Discharge Progression Outcomes Goal: Tolerating diet Outcome: Completed/Met Date Met:  09/07/12 No nausea Goal: Other Discharge Outcomes/Goals Outcome: Completed/Met Date Met:  09/07/12 Discharge instructions read to pt and his wife They both verbalized understanding of the instructions.  Pt discharged to home with his wife

## 2012-09-07 NOTE — Discharge Summary (Signed)
Physician Discharge Summary  Patient ID: Brad Galvan MRN: [id number] DOB/AGE: Nov 24, 1986 26 y.o.  Admit date: 09/06/2012 Discharge date: 09/07/2012  Admission Diagnoses: fracture of the right fourth metacarpal  Discharge Diagnoses: fracture of the right fourth metacarpal Active Problems:  * No active hospital problems. *    Discharged Condition: good  Hospital Course: He had surgery with ORIF of the right fourth metacarpal and was admitted to observation post operatively.  He did well with little problem.  He is to go home now with oral pain medicine.  Consults: None  Significant Diagnostic Studies: radiology: X-Ray: placement of plate and screws to the right fourth metacarpal  Treatments: surgery: OTIF to the right fourth metacarpal  Discharge Exam: Blood pressure 118/81, pulse 69, temperature 98.8 F (37.1 C), temperature source Oral, resp. rate 20, height 5\' 6"  (1.676 m), weight 87.1 kg (192 lb 0.3 oz), SpO2 94.00%. General appearance: alert.  His right hand dressing is dry.  NV is intact.  Disposition: 01-Home or Self Care  Discharge Orders    Future Orders Please Complete By Expires   Diet - low sodium heart healthy      Call MD / Call 911      Comments:   If you experience chest pain or shortness of breath, CALL 911 and be transported to the hospital emergency room.  If you develope a fever above 101 F, pus (white drainage) or increased drainage or redness at the wound, or calf pain, call your surgeon's office.   Constipation Prevention      Comments:   Drink plenty of fluids.  Prune juice may be helpful.  You may use a stool softener, such as Colace (over the counter) 100 mg twice a day.  Use MiraLax (over the counter) for constipation as needed.   Increase activity slowly as tolerated      Discharge instructions      Comments:   Keep hand dressing dry.  Elevate as needed.  Do not remove the dressing.  See Dr. Hilda Lias in two weeks. Call if any problem office:  [phone] or hospital after hours [phone].       Medication List     As of 09/07/2012  8:03 AM    TAKE these medications         EPINEPHrine 0.3 mg/0.3 mL Devi   Commonly known as: EPI-PEN   Inject 0.3 mg into the muscle as needed. Allergic Reaction      HYDROcodone-acetaminophen 7.5-325 MG per tablet   Commonly known as: NORCO   Take 1 tablet by mouth every 6 (six) hours as needed. Pain      naproxen 250 MG tablet   Commonly known as: NAPROSYN   Take 250 mg by mouth 2 (two) times daily as needed. Pain         Signed: Darreld Mclean 09/07/2012, 8:03 AM

## 2012-09-07 NOTE — Progress Notes (Signed)
Subjective: 1 Day Post-Op Procedure(s) (LRB): OPEN REDUCTION INTERNAL FIXATION (ORIF) METACARPAL (FINGER) FRACTURE (Right) Patient reports pain as 2 on 0-10 scale.    Objective: Vital signs in last 24 hours: Temp:  [97.7 F (36.5 C)-98.8 F (37.1 C)] 98.8 F (37.1 C) (09/12 0409) Pulse Rate:  [64-87] 69  (09/12 0409) Resp:  [12-25] 20  (09/12 0409) BP: (100-142)/(66-90) 118/81 mmHg (09/12 0409) SpO2:  [92 %-100 %] 94 % (09/12 0409) Weight:  [87.1 kg (192 lb 0.3 oz)] 87.1 kg (192 lb 0.3 oz) (09/11 2221)  Intake/Output from previous day: 09/11 0701 - 09/12 0700 In: 1738.3 [P.O.:240; I.V.:1498.3] Out: 5 [Blood:5] Intake/Output this shift: Total I/O In: 459.2 [I.V.:459.2] Out: -    Basename 09/05/12 0941  HGB 14.9    Basename 09/05/12 0941  WBC 5.2  RBC 5.00  HCT 42.9  PLT 227    Basename 09/05/12 0941  NA 139  K 3.9  CL 102  CO2 30  BUN 16  CREATININE 0.86  GLUCOSE 67*  CALCIUM 9.7   No results found for this basename: LABPT:2,INR:2 in the last 72 hours  Neurologically intact Neurovascular intact Sensation intact distally Intact pulses distally Dorsiflexion/Plantar flexion intact He had a good night.  Pain is controlled. Assessment/Plan: 1 Day Post-Op Procedure(s) (LRB): OPEN REDUCTION INTERNAL FIXATION (ORIF) METACARPAL (FINGER) FRACTURE (Right) Discharge home.  Brad Galvan 09/07/2012, 7:59 AM

## 2012-09-07 NOTE — Progress Notes (Signed)
UR Chart Review Completed  

## 2012-09-07 NOTE — Anesthesia Postprocedure Evaluation (Signed)
  Anesthesia Post-op Note  Patient: Brad Galvan  Procedure(s) Performed: Procedure(s) (LRB) with comments: OPEN REDUCTION INTERNAL FIXATION (ORIF) METACARPAL (FINGER) FRACTURE (Right)  Patient Location: room 204  Anesthesia Type: General  Level of Consciousness: awake and patient cooperative  Airway and Oxygen Therapy: Patient Spontanous Breathing  Post-op Pain: 2 /10, mild  Post-op Assessment: Post-op Vital signs reviewed, Patient's Cardiovascular Status Stable, Respiratory Function Stable, Patent Airway, No signs of Nausea or vomiting and Adequate PO intake  Post-op Vital Signs: Reviewed and stable  Complications: No apparent anesthesia complications

## 2012-09-08 ENCOUNTER — Encounter (HOSPITAL_COMMUNITY): Payer: Self-pay | Admitting: Orthopaedic Surgery

## 2012-11-20 ENCOUNTER — Ambulatory Visit (HOSPITAL_COMMUNITY)
Admission: RE | Admit: 2012-11-20 | Discharge: 2012-11-20 | Disposition: A | Discharge status: Home / Self Care | Payer: Self-pay | Source: Ambulatory Visit | Attending: Orthopaedic Surgery | Admitting: Orthopaedic Surgery

## 2012-11-20 ENCOUNTER — Other Ambulatory Visit (HOSPITAL_COMMUNITY): Payer: Self-pay | Admitting: Orthopaedic Surgery

## 2012-11-20 DIAGNOSIS — S62309A Unspecified fracture of unspecified metacarpal bone, initial encounter for closed fracture: Secondary | ICD-10-CM | POA: Insufficient documentation

## 2012-11-20 DIAGNOSIS — X58XXXA Exposure to other specified factors, initial encounter: Secondary | ICD-10-CM | POA: Insufficient documentation

## 2013-04-23 ENCOUNTER — Emergency Department (HOSPITAL_COMMUNITY): Payer: Medicaid Other

## 2013-04-23 ENCOUNTER — Emergency Department (HOSPITAL_COMMUNITY)
Admission: EM | Admit: 2013-04-23 | Discharge: 2013-04-23 | Disposition: A | Discharge status: Home / Self Care | Payer: Medicaid Other | Attending: Emergency Medicine | Admitting: Emergency Medicine

## 2013-04-23 ENCOUNTER — Encounter (HOSPITAL_COMMUNITY): Payer: Self-pay | Admitting: *Deleted

## 2013-04-23 DIAGNOSIS — S20212A Contusion of left front wall of thorax, initial encounter: Secondary | ICD-10-CM

## 2013-04-23 DIAGNOSIS — Y929 Unspecified place or not applicable: Secondary | ICD-10-CM | POA: Insufficient documentation

## 2013-04-23 DIAGNOSIS — Y9389 Activity, other specified: Secondary | ICD-10-CM | POA: Insufficient documentation

## 2013-04-23 DIAGNOSIS — S20219A Contusion of unspecified front wall of thorax, initial encounter: Secondary | ICD-10-CM | POA: Insufficient documentation

## 2013-04-23 DIAGNOSIS — IMO0002 Reserved for concepts with insufficient information to code with codable children: Secondary | ICD-10-CM | POA: Insufficient documentation

## 2013-04-23 MED ORDER — OXYCODONE-ACETAMINOPHEN 5-325 MG PO TABS
ORAL_TABLET | ORAL | Status: AC
  Administered 2013-04-23: 1 via ORAL
  Filled 2013-04-23: qty 1

## 2013-04-23 MED ORDER — OXYCODONE-ACETAMINOPHEN 5-325 MG PO TABS
1.0000 | ORAL_TABLET | Freq: Once | ORAL | Status: AC
  Administered 2013-04-23: 1 via ORAL

## 2013-04-23 MED ORDER — HYDROCODONE-ACETAMINOPHEN 5-325 MG PO TABS: 1.0000 | ORAL_TABLET | ORAL | Status: DC | PRN
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
## 2013-04-23 NOTE — ED Notes (Signed)
Patient with no complaints at this time. Respirations even and unlabored. Skin warm/dry. Discharge instructions reviewed with patient at this time. Patient given opportunity to voice concerns/ask questions. Patient discharged at this time and left Emergency Department with steady gait.   

## 2013-04-23 NOTE — ED Provider Notes (Signed)
Medical screening examination/treatment/procedure(s) were performed by non-physician practitioner and as supervising physician I was immediately available for consultation/collaboration.   Charles B. Sheldon, MD 04/23/13 2052 
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
## 2013-08-09 ENCOUNTER — Emergency Department (HOSPITAL_COMMUNITY): Payer: Self-pay

## 2013-08-09 ENCOUNTER — Encounter (HOSPITAL_COMMUNITY): Payer: Self-pay

## 2013-08-09 ENCOUNTER — Emergency Department (HOSPITAL_COMMUNITY)
Admission: EM | Admit: 2013-08-09 | Discharge: 2013-08-09 | Disposition: A | Discharge status: Home / Self Care | Payer: Self-pay | Attending: Emergency Medicine | Admitting: Emergency Medicine

## 2013-08-09 DIAGNOSIS — X58XXXA Exposure to other specified factors, initial encounter: Secondary | ICD-10-CM | POA: Insufficient documentation

## 2013-08-09 DIAGNOSIS — S20219A Contusion of unspecified front wall of thorax, initial encounter: Secondary | ICD-10-CM | POA: Insufficient documentation

## 2013-08-09 DIAGNOSIS — Y92838 Other recreation area as the place of occurrence of the external cause: Secondary | ICD-10-CM | POA: Insufficient documentation

## 2013-08-09 DIAGNOSIS — Y9239 Other specified sports and athletic area as the place of occurrence of the external cause: Secondary | ICD-10-CM | POA: Insufficient documentation

## 2013-08-09 DIAGNOSIS — Y9361 Activity, american tackle football: Secondary | ICD-10-CM | POA: Insufficient documentation

## 2013-08-09 DIAGNOSIS — S20211A Contusion of right front wall of thorax, initial encounter: Secondary | ICD-10-CM

## 2013-08-09 MED ORDER — TRAMADOL HCL 50 MG PO TABS: 50.0000 mg | ORAL_TABLET | Freq: Four times a day (QID) | ORAL | Status: DC | PRN

## 2013-08-09 MED ORDER — IBUPROFEN 600 MG PO TABS
600.0000 mg | ORAL_TABLET | Freq: Four times a day (QID) | ORAL | Status: DC | PRN
Start: 2013-08-09 — End: 2015-12-01

## 2013-08-09 MED ORDER — IBUPROFEN 800 MG PO TABS
800.0000 mg | ORAL_TABLET | Freq: Once | ORAL | Status: AC
  Administered 2013-08-09: 800 mg via ORAL
  Filled 2013-08-09: qty 1

## 2013-08-09 NOTE — ED Notes (Signed)
Playing touch football 3 days ago, felt a pull in right upper chest. Now still painfull

## 2013-08-09 NOTE — ED Notes (Signed)
Pain rt upper ant chest for 3 days, after playing football, Hurts to move or cough.  NAD  Feels like "something moving " in chest

## 2013-08-12 NOTE — ED Provider Notes (Signed)
[name]: [id number]     Arrival date & time 08/09/13  2126 History     First MD Initiated Contact with Patient 08/09/13 2143     Chief Complaint  Patient presents with  . Chest Injury   (Consider location/radiation/quality/duration/timing/severity/associated sxs/prior Treatment) HPI Comments: Brad Galvan is a 27 y.o. Male presenting with persistent pain in his right upper chest which started 3 days ago when playing touch football.  He describes landing on his chest during a touch football game and felt a pulling/popping sensation in his chest with persistent pain since which triggered by palpation, stretching and deep inspiration. He denies shortness of breath, cough, fever or chills.  He has had no treatments prior to arrival.      The history is provided by the patient.    No past medical history on file. Past Surgical History  Procedure Laterality Date  . Amputation      right ring fingertip  . Orif finger fracture  09/06/2012    Procedure: OPEN REDUCTION INTERNAL FIXATION (ORIF) METACARPAL (FINGER) FRACTURE;  Surgeon: Darreld Mclean, MD;  Location: [facility];  Service: Orthopedics;  Laterality: Right;   No family history on file. History  Substance Use Topics  . Smoking status: Never Smoker   . Smokeless tobacco: Not on file  . Alcohol Use: No     Comment: occassional    Review of Systems  Constitutional: Negative for fever.  HENT: Negative for congestion, sore throat and neck pain.   Eyes: Negative.   Respiratory: Negative for cough, chest tightness and shortness of breath.   Cardiovascular: Positive for chest pain. Negative for palpitations.  Gastrointestinal: Negative for nausea and abdominal pain.  Genitourinary: Negative.   Musculoskeletal: Negative for joint swelling and arthralgias.  Skin: Negative.  Negative for rash and wound.  Neurological: Negative for dizziness, weakness, light-headedness, numbness and headaches.  Psychiatric/Behavioral: Negative.      Allergies  Bee venom  Home Medications   Current Outpatient Rx  Name  Route  Sig  Dispense  Refill  . EPINEPHrine (EPI-PEN) 0.3 mg/0.3 mL DEVI   Intramuscular   Inject 0.3 mg into the muscle as needed. Allergic Reaction         . ibuprofen (ADVIL,MOTRIN) 600 MG tablet   Oral   Take 1 tablet (600 mg total) by mouth every 6 (six) hours as needed for pain.   30 tablet   0   . traMADol (ULTRAM) 50 MG tablet   Oral   Take 1 tablet (50 mg total) by mouth every 6 (six) hours as needed for pain.   15 tablet   0    BP 125/82  Pulse 87  Temp(Src) 98.8 F (37.1 C) (Oral)  Resp 16  Ht 5\' 6"  (1.676 m)  Wt 182 lb (82.555 kg)  BMI 29.39 kg/m2  SpO2 98% Physical Exam  Nursing note and vitals reviewed. Constitutional: He appears well-developed and well-nourished.  HENT:  Head: Normocephalic and atraumatic.  Eyes: Conjunctivae are normal.  Neck: Normal range of motion.  Cardiovascular: Normal rate, regular rhythm, normal heart sounds and intact distal pulses.   Pulmonary/Chest: Effort normal and breath sounds normal. He has no wheezes. He exhibits tenderness.    Abdominal: Soft. Bowel sounds are normal. There is no tenderness.  Musculoskeletal: Normal range of motion.  Neurological: He is alert.  Skin: Skin is warm and dry.  Psychiatric: He has a normal mood and affect.    ED Course   Procedures (including critical care  time)  Labs Reviewed - No data to display No results found. 1. Chest wall contusion, right, initial encounter     MDM  Patients labs and/or radiological studies were viewed and considered during the medical decision making and disposition process. No rib fx or other bony chest wall injury.  Suspect muscle or cartilage strain. Pain is reproducible.  He was prescribed ibuprofen, tramadol. Encouraged heat application to chest wall.  Prn f/u.  The patient appears reasonably screened and/or stabilized for discharge and I doubt any other medical  condition or other [facility] requiring further screening, evaluation, or treatment in the ED at this time prior to discharge.   Burgess Amor, PA-C 08/12/13 0225

## 2013-08-13 NOTE — ED Provider Notes (Signed)
Medical screening examination/treatment/procedure(s) were performed by non-physician practitioner and as supervising physician I was immediately available for consultation/collaboration.   Joya Gaskins, MD 08/13/13 [phone]

## 2014-12-31 ENCOUNTER — Emergency Department (HOSPITAL_COMMUNITY)
Admission: EM | Admit: 2014-12-31 | Discharge: 2014-12-31 | Disposition: A | Discharge status: Home / Self Care | Payer: Medicaid Other | Attending: Emergency Medicine | Admitting: Emergency Medicine

## 2014-12-31 ENCOUNTER — Encounter (HOSPITAL_COMMUNITY): Payer: Self-pay | Admitting: Emergency Medicine

## 2014-12-31 DIAGNOSIS — S39012A Strain of muscle, fascia and tendon of lower back, initial encounter: Secondary | ICD-10-CM

## 2014-12-31 DIAGNOSIS — Y998 Other external cause status: Secondary | ICD-10-CM | POA: Insufficient documentation

## 2014-12-31 DIAGNOSIS — Y9289 Other specified places as the place of occurrence of the external cause: Secondary | ICD-10-CM | POA: Insufficient documentation

## 2014-12-31 DIAGNOSIS — X58XXXA Exposure to other specified factors, initial encounter: Secondary | ICD-10-CM | POA: Insufficient documentation

## 2014-12-31 DIAGNOSIS — Y9389 Activity, other specified: Secondary | ICD-10-CM | POA: Insufficient documentation

## 2014-12-31 MED ORDER — METHOCARBAMOL 500 MG PO TABS: 1000.0000 mg | ORAL_TABLET | Freq: Three times a day (TID) | ORAL | Status: DC

## 2014-12-31 MED ORDER — CELECOXIB 100 MG PO CAPS
100.0000 mg | ORAL_CAPSULE | Freq: Two times a day (BID) | ORAL | Status: DC
Start: 2014-12-31 — End: 2015-12-01

## 2014-12-31 NOTE — ED Provider Notes (Signed)
[name]: [id number]     Arrival date & time 12/31/14  0757 History   First MD Initiated Contact with Patient 12/31/14 [phone]     Chief Complaint  Patient presents with  . Back Pain     (Consider location/radiation/quality/duration/timing/severity/associated sxs/prior Treatment) Patient is a 29 y.o. male presenting with back pain. The history is provided by the patient.  Back Pain Location:  Lumbar spine Quality:  Stabbing Radiates to:  Does not radiate Pain severity:  Moderate Pain is:  Same all the time Onset quality:  Sudden Duration:  2 days Timing:  Intermittent Progression:  Unchanged Chronicity:  New Context: lifting heavy objects   Relieved by:  Bed rest Worsened by:  Bending Ineffective treatments:  Heating pad Associated symptoms: no abdominal pain, no bladder incontinence, no bowel incontinence, no chest pain, no dysuria and no perianal numbness     History reviewed. No pertinent past medical history. Past Surgical History  Procedure Laterality Date  . Amputation      right ring fingertip  . Orif finger fracture  09/06/2012    Procedure: OPEN REDUCTION INTERNAL FIXATION (ORIF) METACARPAL (FINGER) FRACTURE;  Surgeon: Darreld McleanWayne Keeling, MD;  Location: [facility];  Service: Orthopedics;  Laterality: Right;   History reviewed. No pertinent family history. History  Substance Use Topics  . Smoking status: Never Smoker   . Smokeless tobacco: Never Used  . Alcohol Use: Yes     Comment: occassional    Review of Systems  Constitutional: Negative for activity change.       All ROS Neg except as noted in HPI  HENT: Negative for nosebleeds.   Eyes: Negative for photophobia and discharge.  Respiratory: Negative for cough, shortness of breath and wheezing.   Cardiovascular: Negative for chest pain and palpitations.  Gastrointestinal: Negative for abdominal pain, blood in stool and bowel incontinence.  Genitourinary: Negative for bladder incontinence, dysuria, frequency and hematuria.   Musculoskeletal: Positive for back pain. Negative for arthralgias and neck pain.  Skin: Negative.   Neurological: Negative for dizziness, seizures and speech difficulty.  Psychiatric/Behavioral: Negative for hallucinations and confusion.      Allergies  Bee venom  Home Medications   Prior to Admission medications   Medication Sig Start Date End Date Taking? Authorizing Provider  EPINEPHrine (EPI-PEN) 0.3 mg/0.3 mL DEVI Inject 0.3 mg into the muscle as needed. Allergic Reaction 08/16/12   Samuel JesterKathleen McManus, DO  ibuprofen (ADVIL,MOTRIN) 600 MG tablet Take 1 tablet (600 mg total) by mouth every 6 (six) hours as needed for pain. 08/09/13   Burgess AmorJulie Idol, PA-C  traMADol (ULTRAM) 50 MG tablet Take 1 tablet (50 mg total) by mouth every 6 (six) hours as needed for pain. 08/09/13   Burgess AmorJulie Idol, PA-C   BP 125/89 mmHg  Pulse 85  Temp(Src) 97.8 F (36.6 C) (Oral)  Resp 16  Ht 5\' 7"  (1.702 m)  Wt 180 lb (81.647 kg)  BMI 28.19 kg/m2  SpO2 100% Physical Exam  Constitutional: He is oriented to person, place, and time. He appears well-developed and well-nourished.  Non-toxic appearance.  HENT:  Head: Normocephalic.  Right Ear: Tympanic membrane and external ear normal.  Left Ear: Tympanic membrane and external ear normal.  Eyes: EOM and lids are normal. Pupils are equal, round, and reactive to light.  Neck: Normal range of motion. Neck supple. Carotid bruit is not present.  Cardiovascular: Normal rate, regular rhythm, normal heart sounds, intact distal pulses and normal pulses.   Pulmonary/Chest: Breath sounds normal. No respiratory distress.  Abdominal: Soft. Bowel sounds are normal. There is no tenderness. There is no guarding.  Musculoskeletal:       Lumbar back: He exhibits decreased range of motion, tenderness and spasm.  Lymphadenopathy:       Head (right side): No submandibular adenopathy present.       Head (left side): No submandibular adenopathy present.    He has no cervical  adenopathy.  Neurological: He is alert and oriented to person, place, and time. He has normal strength. No cranial nerve deficit or sensory deficit.  Skin: Skin is warm and dry.  Psychiatric: He has a normal mood and affect. His speech is normal.  Nursing note and vitals reviewed.   ED Course  Procedures (including critical care time) Labs Review Labs Reviewed - No data to display  Imaging Review No results found.   EKG Interpretation None      MDM Exam suggest lumbar strain. No gross neuro deficit noted. Pt ambulatory. Vital signs stable. Rx for celebrex and robaxin given to the patient.   Final diagnoses:  Lumbar strain, initial encounter    *I have reviewed nursing notes, vital signs, and all appropriate lab and imaging results for this patient.[address] Garry Heater, PA-C 01/03/15 1353  Donnetta Hutching, MD 01/04/15 [phone]

## 2014-12-31 NOTE — ED Notes (Signed)
Pt reports helped move a wood stove Sunday and Monday when he got out of bed, felt a pop in lower back.  C/O pain in r lower back since then.  Describes as sharp.  Denies urinary symptoms.

## 2014-12-31 NOTE — ED Notes (Signed)
Patient c/o mid right back pain that started yesterday morning. Per patient no known injury but reports he did help move a wood stove Sunday with no problems. Denies any urinary symptoms. Per patient pain worse with bending. Took tylenol yesterday with no relief.

## 2014-12-31 NOTE — Discharge Instructions (Signed)

## 2015-12-01 ENCOUNTER — Encounter (HOSPITAL_COMMUNITY): Payer: Self-pay | Admitting: Emergency Medicine

## 2015-12-01 ENCOUNTER — Emergency Department (HOSPITAL_COMMUNITY)
Admission: EM | Admit: 2015-12-01 | Discharge: 2015-12-01 | Disposition: A | Discharge status: Home / Self Care | Payer: Medicaid Other | Attending: Emergency Medicine | Admitting: Emergency Medicine

## 2015-12-01 DIAGNOSIS — K644 Residual hemorrhoidal skin tags: Secondary | ICD-10-CM

## 2015-12-01 DIAGNOSIS — Z8781 Personal history of (healed) traumatic fracture: Secondary | ICD-10-CM | POA: Insufficient documentation

## 2015-12-01 MED ORDER — HYDROCODONE-ACETAMINOPHEN 5-325 MG PO TABS: 2.0000 | ORAL_TABLET | ORAL | Status: DC | PRN

## 2015-12-01 MED ORDER — HYDROCORTISONE 2.5 % RE CREA: TOPICAL_CREAM | RECTAL | Status: DC

## 2015-12-01 MED ORDER — DOCUSATE SODIUM 100 MG PO CAPS: 100.0000 mg | ORAL_CAPSULE | Freq: Two times a day (BID) | ORAL | Status: DC

## 2015-12-01 NOTE — ED Notes (Signed)
Patient states small amount of bright red bleeding from rectum today. States h/o hemorrhoids. Tried A&D ointment with small amount of pain relief.

## 2015-12-01 NOTE — ED Provider Notes (Signed)
[name]: [id number]     Arrival date & time 12/01/15  8295 History  By signing my name below, I, Gwenyth Ober, attest that this documentation has been prepared under the direction and in the presence of Rolland Porter, MD.  Electronically Signed: Gwenyth Ober, ED Scribe. 12/01/2015. 9:51 AM.  Chief Complaint  Patient presents with  . Rectal Bleeding   The history is provided by the patient. No language interpreter was used.   HPI Comments: Brad Galvan is a 29 y.o. male who presents to the Emergency Department complaining of constant, moderate rectal pain that started yesterday. He states mild rectal bleeding that occurs with wiping and started after the pain as an associated symptom. Pt reports feeling a lump around his anus. He tried applying A&D ointment with minimal relief. Pt works in [employer] and is required to do heavy lifting. He did not work yesterday. Pt has a history of rectal bleeding which was evaluated by his PCP and attributed to constipation. He denies current constipation.  History reviewed. No pertinent past medical history. Past Surgical History  Procedure Laterality Date  . Amputation      right ring fingertip  . Orif finger fracture  09/06/2012    Procedure: OPEN REDUCTION INTERNAL FIXATION (ORIF) METACARPAL (FINGER) FRACTURE;  Surgeon: Darreld Mclean, MD;  Location: [facility];  Service: Orthopedics;  Laterality: Right;   No family history on file. Social History  Substance Use Topics  . Smoking status: Never Smoker   . Smokeless tobacco: Never Used  . Alcohol Use: Yes     Comment: occassional    Review of Systems  Constitutional: Negative for fever, chills, diaphoresis, appetite change and fatigue.  HENT: Negative for mouth sores, sore throat and trouble swallowing.   Eyes: Negative for visual disturbance.  Respiratory: Negative for cough, chest tightness, shortness of breath and wheezing.   Cardiovascular: Negative for chest pain.  Gastrointestinal: Positive  for anal bleeding and rectal pain. Negative for nausea, vomiting, abdominal pain, diarrhea, constipation and abdominal distention.  Endocrine: Negative for polydipsia, polyphagia and polyuria.  Genitourinary: Negative for dysuria, frequency and hematuria.  Musculoskeletal: Negative for gait problem.  Skin: Negative for color change, pallor and rash.  Neurological: Negative for dizziness, syncope, light-headedness and headaches.  Hematological: Does not bruise/bleed easily.  Psychiatric/Behavioral: Negative for behavioral problems and confusion.   Allergies  Bee venom  Home Medications   Prior to Admission medications   Medication Sig Start Date End Date Taking? Authorizing Provider  EPINEPHrine (EPI-PEN) 0.3 mg/0.3 mL DEVI Inject 0.3 mg into the muscle as needed. Allergic Reaction 08/16/12  Yes Samuel Jester, DO  docusate sodium (COLACE) 100 MG capsule Take 1 capsule (100 mg total) by mouth every 12 (twelve) hours. 12/01/15   Rolland Porter, MD  HYDROcodone-acetaminophen (NORCO/VICODIN) 5-325 MG tablet Take 2 tablets by mouth every 4 (four) hours as needed. 12/01/15   Rolland Porter, MD  hydrocortisone (ANUSOL-HC) 2.5 % rectal cream Apply rectally 2 times daily 12/01/15   Rolland Porter, MD   BP 120/90 mmHg  Pulse 61  Temp(Src) 98.4 F (36.9 C) (Oral)  Resp 18  Ht  (1.702 m)  Wt 190 lb (86.183 kg)  BMI 29.75 kg/m2  SpO2 100% Physical Exam  Constitutional: He is oriented to person, place, and time. He appears well-developed and well-nourished. No distress.  HENT:  Head: Normocephalic.  Eyes: Conjunctivae are normal. Pupils are equal, round, and reactive to light. No scleral icterus.  Neck: Normal range of motion. Neck supple.  No thyromegaly present.  Cardiovascular: Normal rate and regular rhythm.  Exam reveals no gallop and no friction rub.   No murmur heard. Pulmonary/Chest: Effort normal and breath sounds normal. No respiratory distress. He has no wheezes. He has no rales.   Abdominal: Soft. Bowel sounds are normal. He exhibits no distension. There is no tenderness. There is no rebound.  Genitourinary:  2-3 o'clock position: 1 cm hemorrhoid with small ulcerated area  Musculoskeletal: Normal range of motion.  Neurological: He is alert and oriented to person, place, and time.  Skin: Skin is warm and dry. No rash noted.  Psychiatric: He has a normal mood and affect. His behavior is normal.  Nursing note and vitals reviewed.   ED Course  Procedures  DIAGNOSTIC STUDIES: Oxygen Saturation is 100% on RA, normal by my interpretation.    COORDINATION OF CARE: 10:01 AM Discussed treatment plan with pt at bedside and pt agreed to plan.  Labs Review Labs Reviewed - No data to display  Imaging Review No results found.   EKG Interpretation None      MDM   Final diagnoses:  External hemorrhoid  I personally performed the services described in this documentation, which was scribed in my presence. The recorded information has been reviewed and is accurate.    Rolland PorterMark Bronco, MD 12/03/15 [phone]

## 2015-12-01 NOTE — Discharge Instructions (Signed)
Hemorrhoids °Hemorrhoids are swollen veins around the rectum or anus. There are two types of hemorrhoids:  °· Internal hemorrhoids. These occur in the veins just inside the rectum. They may poke through to the outside and become irritated and painful. °· External hemorrhoids. These occur in the veins outside the anus and can be felt as a painful swelling or hard lump near the anus. °CAUSES °· Pregnancy.   °· Obesity.   °· Constipation or diarrhea.   °· Straining to have a bowel movement.   °· Sitting for long periods on the toilet. °· Heavy lifting or other activity that caused you to strain. °· Anal intercourse. °SYMPTOMS  °· Pain.   °· Anal itching or irritation.   °· Rectal bleeding.   °· Fecal leakage.   °· Anal swelling.   °· One or more lumps around the anus.   °DIAGNOSIS  °Your caregiver may be able to diagnose hemorrhoids by visual examination. Other examinations or tests that may be performed include:  °· Examination of the rectal area with a gloved hand (digital rectal exam).   °· Examination of anal canal using a small tube (scope).   °· A blood test if you have lost a significant amount of blood. °· A test to look inside the colon (sigmoidoscopy or colonoscopy). °TREATMENT °Most hemorrhoids can be treated at home. However, if symptoms do not seem to be getting better or if you have a lot of rectal bleeding, your caregiver may perform a procedure to help make the hemorrhoids get smaller or remove them completely. Possible treatments include:  °· Placing a rubber band at the base of the hemorrhoid to cut off the circulation (rubber band ligation).   °· Injecting a chemical to shrink the hemorrhoid (sclerotherapy).   °· Using a tool to burn the hemorrhoid (infrared light therapy).   °· Surgically removing the hemorrhoid (hemorrhoidectomy).   °· Stapling the hemorrhoid to block blood flow to the tissue (hemorrhoid stapling).   °HOME CARE INSTRUCTIONS  °· Eat foods with fiber, such as whole grains, beans,  nuts, fruits, and vegetables. Ask your doctor about taking products with added fiber in them (fiber supplements). °· Increase fluid intake. Drink enough water and fluids to keep your urine clear or pale yellow.   °· Exercise regularly.   °· Go to the bathroom when you have the urge to have a bowel movement. Do not wait.   °· Avoid straining to have bowel movements.   °· Keep the anal area dry and clean. Use wet toilet paper or moist towelettes after a bowel movement.   °· Medicated creams and suppositories may be used or applied as directed.   °· Only take over-the-counter or prescription medicines as directed by your caregiver.   °· Take warm sitz baths for 15-20 minutes, 3-4 times a day to ease pain and discomfort.   °· Place ice packs on the hemorrhoids if they are tender and swollen. Using ice packs between sitz baths may be helpful.   °· Put ice in a plastic bag.   °· Place a towel between your skin and the bag.   °· Leave the ice on for 15-20 minutes, 3-4 times a day.   °· Do not use a donut-shaped pillow or sit on the toilet for long periods. This increases blood pooling and pain.   °SEEK MEDICAL CARE IF: °· You have increasing pain and swelling that is not controlled by treatment or medicine. °· You have uncontrolled bleeding. °· You have difficulty or you are unable to have a bowel movement. °· You have pain or inflammation outside the area of the hemorrhoids. °MAKE SURE YOU: °· Understand these instructions. °·   Will watch your condition. °· Will get help right away if you are not doing well or get worse. °  °This information is not intended to replace advice given to you by your health care provider. Make sure you discuss any questions you have with your health care provider. °  °Document Released: 12/10/2000 Document Revised: 11/29/2012 Document Reviewed: 10/17/2012 °Elsevier Interactive Patient Education ©2016 [employer] ° °High-Fiber Diet °Fiber, also called dietary fiber, is a type of carbohydrate  found in fruits, vegetables, whole grains, and beans. A high-fiber diet can have many health benefits. Your health care provider may recommend a high-fiber diet to help: °· Prevent constipation. Fiber can make your bowel movements more regular. °· Lower your cholesterol. °· Relieve hemorrhoids, uncomplicated diverticulosis, or irritable bowel syndrome. °· Prevent overeating as part of a weight-loss plan. °· Prevent heart disease, type 2 diabetes, and certain cancers. °WHAT IS MY PLAN? °The recommended daily intake of fiber includes: °· 38 grams for men under age 50. °· 30 grams for men over age 50. °· 25 grams for women under age 50. °· 21 grams for women over age 50. °You can get the recommended daily intake of dietary fiber by eating a variety of fruits, vegetables, grains, and beans. Your health care provider may also recommend a fiber supplement if it is not possible to get enough fiber through your diet. °WHAT DO I NEED TO KNOW ABOUT A HIGH-FIBER DIET? °· Fiber supplements have not been widely studied for their effectiveness, so it is better to get fiber through food sources. °· Always check the fiber content on the nutrition facts label of any prepackaged food. Look for foods that contain at least 5 grams of fiber per serving. °· Ask your dietitian if you have questions about specific foods that are related to your condition, especially if those foods are not listed in the following section. °· Increase your daily fiber consumption gradually. Increasing your intake of dietary fiber too quickly may cause bloating, cramping, or gas. °· Drink plenty of water. Water helps you to digest fiber. °WHAT FOODS CAN I EAT? °Grains °Whole-grain breads. Multigrain cereal. Oats and oatmeal. Brown rice. Barley. Bulgur wheat. Millet. Bran muffins. Popcorn. Rye wafer crackers. °Vegetables °Sweet potatoes. Spinach. Kale. Artichokes. Cabbage. Broccoli. Green peas. Carrots. Squash. °Fruits °Berries. Pears. Apples. Oranges.  Avocados. Prunes and raisins. Dried figs. °Meats and Other Protein Sources °Navy, kidney, pinto, and soy beans. Split peas. Lentils. Nuts and seeds. °Dairy °Fiber-fortified yogurt. °Beverages °Fiber-fortified soy milk. Fiber-fortified orange juice. °Other °Fiber bars. °The items listed above may not be a complete list of recommended foods or beverages. Contact your dietitian for more options. °WHAT FOODS ARE NOT RECOMMENDED? °Grains °White bread. Pasta made with refined flour. White rice. °Vegetables °Fried potatoes. Canned vegetables. Well-cooked vegetables.  °Fruits °Fruit juice. Cooked, strained fruit. °Meats and Other Protein Sources °Fatty cuts of meat. Fried poultry or fried fish. °Dairy °Milk. Yogurt. Cream cheese. Sour cream. °Beverages °Soft drinks. °Other °Cakes and pastries. Butter and oils. °The items listed above may not be a complete list of foods and beverages to avoid. Contact your dietitian for more information. °WHAT ARE SOME TIPS FOR INCLUDING HIGH-FIBER FOODS IN MY DIET? °· Eat a wide variety of high-fiber foods. °· Make sure that half of all grains consumed each day are whole grains. °· Replace breads and cereals made from refined flour or white flour with whole-grain breads and cereals. °· Replace white rice with brown rice, bulgur wheat, or millet. °· Start   the day with a breakfast that is high in fiber, such as a cereal that contains at least 5 grams of fiber per serving. °· Use beans in place of meat in soups, salads, or pasta. °· Eat high-fiber snacks, such as berries, raw vegetables, nuts, or popcorn. °  °This information is not intended to replace advice given to you by your health care provider. Make sure you discuss any questions you have with your health care provider. °  °Document Released: 12/13/2005 Document Revised: 01/03/2015 Document Reviewed: 05/28/2014 °Elsevier Interactive Patient Education ©2016 [employer] ° °Nonsurgical Procedures for Hemorrhoids °Nonsurgical procedures  can be used to treat hemorrhoids. Hemorrhoids are swollen veins that are inside the rectum (internal hemorrhoids) or around the anus (external hemorrhoids). They are caused by increased pressure in the anal area. This pressure may result from straining to have a bowel movement (constipation), diarrhea, pregnancy, obesity, anal sex, or sitting for long periods of time. °Hemorrhoids can cause symptoms such as pain and bleeding. Various procedures may be performed if diet changes, lifestyle changes, and other treatments do not help your symptoms. Some of these procedures do not involve surgery. Three common nonsurgical procedures are: °· Rubber band ligation. Rubber bands are used to cut off the blood supply to the hemorrhoids. °· Sclerotherapy. Medicine is injected into the hemorrhoids to shrink them. °· Infrared coagulation. A type of light energy is used to get rid of the hemorrhoids. °LET YOUR HEALTH CARE PROVIDER KNOW ABOUT: °· Any allergies you have. °· All medicines you are taking, including vitamins, herbs, eye drops, creams, and over-the-counter medicines. °· Previous problems you or members of your family have had with the use of anesthetics. °· Any blood disorders you have. °· Previous surgeries you have had. °· Any medical conditions you have. °· Whether you are pregnant or may be pregnant. °RISKS AND COMPLICATIONS °Generally, this is a safe procedure. However, problems may occur, including: °· Infection. °· Bleeding. °· Pain. °BEFORE THE PROCEDURE °· Ask your health care provider about: °¨ Changing or stopping your regular medicines. This is especially important if you are taking diabetes medicines or blood thinners. °¨ Taking medicines such as aspirin and ibuprofen. These medicines can thin your blood. Do not take these medicines before your procedure if your health care provider instructs you not to. °· You may need to have a procedure to examine the inside of your colon with a scope (colonoscopy). Your  health care provider may do this to make sure that there are no other causes for your bleeding or pain. °PROCEDURE °· Your health care provider will clean your rectal area with a rinsing solution. °· A lubricating jelly may be placed into your rectum. The jelly may contain a medicine to numb the area (local anesthetic). °· Your health care provider will insert a short scope (anoscope) into your rectum to examine the hemorrhoids. °· One of the following techniques will be used. °Rubber Band Ligation °Your health care provider will place medical instruments through the scope to put rubber bands around the base of your hemorrhoids. The bands will cut off the blood supply to the hemorrhoids. The hemorrhoids will fall off after several days. °Sclerotherapy °Your health care provider will inject medicine through the scope into your hemorrhoids. This will cause them to shrink and dry up. °Infrared Coagulation °Your health care provider will shine a type of light through the scope onto your hemorrhoids. This light will generate energy (infrared radiation). It will cause the hemorrhoids to scar   and then fall off. °Each of these procedures may vary among health care providers and hospitals. °AFTER THE PROCEDURE °· You will be monitored to make sure that you have no bleeding. °· Return to your normal activities as told by your health care provider. °  °This information is not intended to replace advice given to you by your health care provider. Make sure you discuss any questions you have with your health care provider. °  °Document Released: 10/10/2009 Document Revised: 09/03/2015 Document Reviewed: 03/10/2015 °Elsevier Interactive Patient Education ©2016 [employer]. ° °

## 2015-12-01 NOTE — ED Notes (Signed)
Patient with no complaints at this time. Respirations even and unlabored. Skin warm/dry. Discharge instructions reviewed with patient at this time. Patient given opportunity to voice concerns/ask questions. Patient discharged at this time and left Emergency Department with steady gait.   

## 2015-12-01 NOTE — ED Notes (Signed)
PT c/o small amount of red blood noted from rectum starting yesterday and states some relief with A&D ointment.

## 2015-12-17 ENCOUNTER — Emergency Department (HOSPITAL_COMMUNITY)
Admission: EM | Admit: 2015-12-17 | Discharge: 2015-12-17 | Disposition: A | Discharge status: Home / Self Care | Payer: Medicaid Other | Attending: Emergency Medicine | Admitting: Emergency Medicine

## 2015-12-17 ENCOUNTER — Encounter (HOSPITAL_COMMUNITY): Payer: Self-pay | Admitting: *Deleted

## 2015-12-17 DIAGNOSIS — R197 Diarrhea, unspecified: Secondary | ICD-10-CM | POA: Insufficient documentation

## 2015-12-17 DIAGNOSIS — Z7952 Long term (current) use of systemic steroids: Secondary | ICD-10-CM | POA: Insufficient documentation

## 2015-12-17 DIAGNOSIS — R112 Nausea with vomiting, unspecified: Secondary | ICD-10-CM | POA: Insufficient documentation

## 2015-12-17 LAB — CBC WITH DIFFERENTIAL/PLATELET
BASOS ABS: 0 10*3/uL (ref 0.0–0.1)
BASOS PCT: 0 %
Eosinophils Absolute: 0.1 10*3/uL (ref 0.0–0.7)
Eosinophils Relative: 1 %
HEMATOCRIT: 47.1 % (ref 39.0–52.0)
Hemoglobin: 16.6 g/dL (ref 13.0–17.0)
Lymphocytes Relative: 6 %
Lymphs Abs: 0.8 10*3/uL (ref 0.7–4.0)
MCH: 31.1 pg (ref 26.0–34.0)
MCHC: 35.2 g/dL (ref 30.0–36.0)
MCV: 88.2 fL (ref 78.0–100.0)
MONO ABS: 1 10*3/uL (ref 0.1–1.0)
Monocytes Relative: 7 %
NEUTROS ABS: 12 10*3/uL — AB (ref 1.7–7.7)
NEUTROS PCT: 86 %
Platelets: 244 10*3/uL (ref 150–400)
RBC: 5.34 MIL/uL (ref 4.22–5.81)
RDW: 12.6 % (ref 11.5–15.5)
WBC: 13.9 10*3/uL — ABNORMAL HIGH (ref 4.0–10.5)

## 2015-12-17 LAB — LIPASE, BLOOD: LIPASE: 20 U/L (ref 11–51)

## 2015-12-17 LAB — COMPREHENSIVE METABOLIC PANEL
ALT: 40 U/L (ref 17–63)
ANION GAP: 6 (ref 5–15)
AST: 32 U/L (ref 15–41)
Albumin: 4.6 g/dL (ref 3.5–5.0)
Alkaline Phosphatase: 56 U/L (ref 38–126)
BILIRUBIN TOTAL: 0.4 mg/dL (ref 0.3–1.2)
BUN: 18 mg/dL (ref 6–20)
CO2: 28 mmol/L (ref 22–32)
Calcium: 9.4 mg/dL (ref 8.9–10.3)
Chloride: 101 mmol/L (ref 101–111)
Creatinine, Ser: 1.03 mg/dL (ref 0.61–1.24)
GFR calc Af Amer: >60 mL/min (ref >60)
Glucose, Bld: 115 mg/dL — ABNORMAL HIGH (ref 65–99)
POTASSIUM: 4.5 mmol/L (ref 3.5–5.1)
Sodium: 135 mmol/L (ref 135–145)
TOTAL PROTEIN: 8 g/dL (ref 6.5–8.1)

## 2015-12-17 MED ORDER — SODIUM CHLORIDE 0.9 % IV SOLN
1000.0000 mL | Freq: Once | INTRAVENOUS | Status: AC
  Administered 2015-12-17: 1000 mL via INTRAVENOUS

## 2015-12-17 MED ORDER — SODIUM CHLORIDE 0.9 % IV SOLN: 1000.0000 mL | INTRAVENOUS | Status: DC

## 2015-12-17 MED ORDER — ONDANSETRON HCL 4 MG/2ML IJ SOLN
4.0000 mg | Freq: Once | INTRAMUSCULAR | Status: AC
  Administered 2015-12-17: 4 mg via INTRAVENOUS
  Filled 2015-12-17: qty 2

## 2015-12-17 MED ORDER — ONDANSETRON HCL 4 MG PO TABS: 4.0000 mg | ORAL_TABLET | Freq: Three times a day (TID) | ORAL | Status: AC | PRN

## 2015-12-17 NOTE — ED Provider Notes (Signed)
[name]: [id number]     Arrival date & time 12/17/15  0305 History   None    Chief Complaint  Patient presents with  . Emesis     (Consider location/radiation/quality/duration/timing/severity/associated sxs/prior Treatment) Patient is a 29 y.o. male presenting with vomiting. The history is provided by the patient.  Emesis He has had some crampy periumbilical pain for the last 3 days. For the last 2 days, there is been some mild diarrhea. Abdominal pain does improve after a bowel movement. Tonight, he vomited and noted some streaks of blood in the emesis. Abdominal pain did also improve after emesis. He rates his pain at 4/10. He denies fever or sweats but has had some cold chills. He denies any sick contacts. Nothing makes symptoms better nothing makes them worse.  History reviewed. No pertinent past medical history. Past Surgical History  Procedure Laterality Date  . Amputation      right ring fingertip  . Orif finger fracture  09/06/2012    Procedure: OPEN REDUCTION INTERNAL FIXATION (ORIF) METACARPAL (FINGER) FRACTURE;  Surgeon: Darreld McleanWayne Keeling, MD;  Location: [facility];  Service: Orthopedics;  Laterality: Right;   History reviewed. No pertinent family history. Social History  Substance Use Topics  . Smoking status: Never Smoker   . Smokeless tobacco: Never Used  . Alcohol Use: Yes     Comment: occassional    Review of Systems  Gastrointestinal: Positive for vomiting.  All other systems reviewed and are negative.     Allergies  Bee venom  Home Medications   Prior to Admission medications   Medication Sig Start Date End Date Taking? Authorizing Provider  docusate sodium (COLACE) 100 MG capsule Take 1 capsule (100 mg total) by mouth every 12 (twelve) hours. 12/01/15   Rolland PorterMark Harshith, MD  EPINEPHrine (EPI-PEN) 0.3 mg/0.3 mL DEVI Inject 0.3 mg into the muscle as needed. Allergic Reaction 08/16/12   Samuel JesterKathleen McManus, DO  HYDROcodone-acetaminophen (NORCO/VICODIN) 5-325 MG tablet Take 2  tablets by mouth every 4 (four) hours as needed. 12/01/15   Rolland PorterMark Messiah, MD  hydrocortisone (ANUSOL-HC) 2.5 % rectal cream Apply rectally 2 times daily 12/01/15   Rolland PorterMark Raynor, MD   BP 119/90 mmHg  Pulse 82  Temp(Src) 98.4 F (36.9 C) (Oral)  Resp 18  Ht 5\' 6"  (1.676 m)  Wt 190 lb (86.183 kg)  BMI 30.68 kg/m2  SpO2 98% Physical Exam  Nursing note and vitals reviewed.  29 year old male, resting comfortably and in no acute distress. Vital signs are normal. Oxygen saturation is 98%, which is normal. Head is normocephalic and atraumatic. PERRLA, EOMI. Oropharynx is clear. Neck is nontender and supple without adenopathy or JVD. Back is nontender and there is no CVA tenderness. Lungs are clear without rales, wheezes, or rhonchi. Chest is nontender. Heart has regular rate and rhythm without murmur. Abdomen is soft, flat, nontender without masses or hepatosplenomegaly and peristalsis is slightly hypoactive. Extremities have no cyanosis or edema, full range of motion is present. Skin is warm and dry without rash. Neurologic: Mental status is normal, cranial nerves are intact, there are no motor or sensory deficits.  ED Course  Procedures (including critical care time) Labs Review Results for orders placed or performed during the hospital encounter of 12/17/15  Comprehensive metabolic panel  Result Value Ref Range   Sodium 135 135 - 145 mmol/L   Potassium 4.5 3.5 - 5.1 mmol/L   Chloride 101 101 - 111 mmol/L   CO2 28 22 - 32 mmol/L  Glucose, Bld 115 (H) 65 - 99 mg/dL   BUN 18 6 - 20 mg/dL   Creatinine, Ser 1.61 0.61 - 1.24 mg/dL   Calcium 9.4 8.9 - 09.6 mg/dL   Total Protein 8.0 6.5 - 8.1 g/dL   Albumin 4.6 3.5 - 5.0 g/dL   AST 32 15 - 41 U/L   ALT 40 17 - 63 U/L   Alkaline Phosphatase 56 38 - 126 U/L   Total Bilirubin 0.4 0.3 - 1.2 mg/dL   GFR calc non Af Amer >60 >60 mL/min   GFR calc Af Amer >60 >60 mL/min   Anion gap 6 5 - 15  Lipase, blood  Result Value Ref Range   Lipase 20  11 - 51 U/L  CBC with Differential  Result Value Ref Range   WBC 13.9 (H) 4.0 - 10.5 K/uL   RBC 5.34 4.22 - 5.81 MIL/uL   Hemoglobin 16.6 13.0 - 17.0 g/dL   HCT 04.5 40.9 - 81.1 %   MCV 88.2 78.0 - 100.0 fL   MCH 31.1 26.0 - 34.0 pg   MCHC 35.2 30.0 - 36.0 g/dL   RDW 91.4 78.2 - 95.6 %   Platelets 244 150 - 400 K/uL   Neutrophils Relative % 86 %   Neutro Abs 12.0 (H) 1.7 - 7.7 K/uL   Lymphocytes Relative 6 %   Lymphs Abs 0.8 0.7 - 4.0 K/uL   Monocytes Relative 7 %   Monocytes Absolute 1.0 0.1 - 1.0 K/uL   Eosinophils Relative 1 %   Eosinophils Absolute 0.1 0.0 - 0.7 K/uL   Basophils Relative 0 %   Basophils Absolute 0.0 0.0 - 0.1 K/uL   I have personally reviewed and evaluated these images and lab results as part of my medical decision-making.    MDM   Final diagnoses:  Nausea vomiting and diarrhea    Crampy abdominal pain with vomiting and diarrhea. No red flags on exam or history to suggest more serious illness. Suspect viral gastroenteritis. Doubt significant GI bleed. Screening labs obtained and he will be given IV fluids with ondansetron. Records are reviewed and he has no relevant past visits.  He feels much better after above noted treatment. He is discharged with a take home pack of ondansetron for nausea and advised to use over-the-counter loperamide as needed for diarrhea.  Brad Booze, MD 12/17/15 [phone]

## 2015-12-17 NOTE — ED Notes (Addendum)
Gave patient a prepackage of four Zofran and instructions on use, patient verbally understands.

## 2015-12-17 NOTE — Discharge Instructions (Signed)
Take loperamide (Imodium AD) as needed for diarrhea. ° °Nausea and Vomiting °Nausea is a sick feeling that often comes before throwing up (vomiting). Vomiting is a reflex where stomach contents come out of your mouth. Vomiting can cause severe loss of body fluids (dehydration). Children and elderly adults can become dehydrated quickly, especially if they also have diarrhea. Nausea and vomiting are symptoms of a condition or disease. It is important to find the cause of your symptoms. °CAUSES  °· Direct irritation of the stomach lining. This irritation can result from increased acid production (gastroesophageal reflux disease), infection, food poisoning, taking certain medicines (such as nonsteroidal anti-inflammatory drugs), alcohol use, or tobacco use. °· Signals from the brain. These signals could be caused by a headache, heat exposure, an inner ear disturbance, increased pressure in the brain from injury, infection, a tumor, or a concussion, pain, emotional stimulus, or metabolic problems. °· An obstruction in the gastrointestinal tract (bowel obstruction). °· Illnesses such as diabetes, hepatitis, gallbladder problems, appendicitis, kidney problems, cancer, sepsis, atypical symptoms of a heart attack, or eating disorders. °· Medical treatments such as chemotherapy and radiation. °· Receiving medicine that makes you sleep (general anesthetic) during surgery. °DIAGNOSIS °Your caregiver may ask for tests to be done if the problems do not improve after a few days. Tests may also be done if symptoms are severe or if the reason for the nausea and vomiting is not clear. Tests may include: °· Urine tests. °· Blood tests. °· Stool tests. °· Cultures (to look for evidence of infection). °· X-rays or other imaging studies. °Test results can help your caregiver make decisions about treatment or the need for additional tests. °TREATMENT °You need to stay well hydrated. Drink frequently but in small amounts. You may wish to  drink water, sports drinks, clear broth, or eat frozen ice pops or gelatin dessert to help stay hydrated. When you eat, eating slowly may help prevent nausea. There are also some antinausea medicines that may help prevent nausea. °HOME CARE INSTRUCTIONS  °· Take all medicine as directed by your caregiver. °· If you do not have an appetite, do not force yourself to eat. However, you must continue to drink fluids. °· If you have an appetite, eat a normal diet unless your caregiver tells you differently. °· Eat a variety of complex carbohydrates (rice, wheat, potatoes, bread), lean meats, yogurt, fruits, and vegetables. °· Avoid high-fat foods because they are more difficult to digest. °· Drink enough water and fluids to keep your urine clear or pale yellow. °· If you are dehydrated, ask your caregiver for specific rehydration instructions. Signs of dehydration may include: °· Severe thirst. °· Dry lips and mouth. °· Dizziness. °· Dark urine. °· Decreasing urine frequency and amount. °· Confusion. °· Rapid breathing or pulse. °SEEK IMMEDIATE MEDICAL CARE IF:  °· You have blood or brown flecks (like coffee grounds) in your vomit. °· You have black or bloody stools. °· You have a severe headache or stiff neck. °· You are confused. °· You have severe abdominal pain. °· You have chest pain or trouble breathing. °· You do not urinate at least once every 8 hours. °· You develop cold or clammy skin. °· You continue to vomit for longer than 24 to 48 hours. °· You have a fever. °MAKE SURE YOU:  °· Understand these instructions. °· Will watch your condition. °· Will get help right away if you are not doing well or get worse. °  °This information is not intended to replace advice given   to you by your health care provider. Make sure you discuss any questions you have with your health care provider. °  °Document Released: 12/13/2005 Document Revised: 03/06/2012 Document Reviewed: 05/12/2011 °Elsevier Interactive Patient Education  ©2016 [employer]. ° °Diarrhea °Diarrhea is frequent loose and watery bowel movements. It can cause you to feel weak and dehydrated. Dehydration can cause you to become tired and thirsty, have a dry mouth, and have decreased urination that often is dark yellow. Diarrhea is a sign of another problem, most often an infection that will not last long. In most cases, diarrhea typically lasts 2-3 days. However, it can last longer if it is a sign of something more serious. It is important to treat your diarrhea as directed by your caregiver to lessen or prevent future episodes of diarrhea. °CAUSES  °Some common causes include: °· Gastrointestinal infections caused by viruses, bacteria, or parasites. °· Food poisoning or food allergies. °· Certain medicines, such as antibiotics, chemotherapy, and laxatives. °· Artificial sweeteners and fructose. °· Digestive disorders. °HOME CARE INSTRUCTIONS °· Ensure adequate fluid intake (hydration): Have 1 cup (8 oz) of fluid for each diarrhea episode. Avoid fluids that contain simple sugars or sports drinks, fruit juices, whole milk products, and sodas. Your urine should be clear or pale yellow if you are drinking enough fluids. Hydrate with an oral rehydration solution that you can purchase at pharmacies, retail stores, and online. You can prepare an oral rehydration solution at home by mixing the following ingredients together: °¨  - tsp table salt. °¨ ¾ tsp baking soda. °¨  tsp salt substitute containing potassium chloride. °¨ 1  tablespoons sugar. °¨ 1 L (34 oz) of water. °· Certain foods and beverages may increase the speed at which food moves through the gastrointestinal (GI) tract. These foods and beverages should be avoided and include: °¨ Caffeinated and alcoholic beverages. °¨ High-fiber foods, such as raw fruits and vegetables, nuts, seeds, and whole grain breads and cereals. °¨ Foods and beverages sweetened with sugar alcohols, such as xylitol, sorbitol, and  mannitol. °· Some foods may be well tolerated and may help thicken stool including: °¨ Starchy foods, such as rice, toast, pasta, low-sugar cereal, oatmeal, grits, baked potatoes, crackers, and bagels. °¨ Bananas. °¨ Applesauce. °· Add probiotic-rich foods to help increase healthy bacteria in the GI tract, such as yogurt and fermented milk products. °· Wash your hands well after each diarrhea episode. °· Only take over-the-counter or prescription medicines as directed by your caregiver. °· Take a warm bath to relieve any burning or pain from frequent diarrhea episodes. °SEEK IMMEDIATE MEDICAL CARE IF:  °· You are unable to keep fluids down. °· You have persistent vomiting. °· You have blood in your stool, or your stools are black and tarry. °· You do not urinate in 6-8 hours, or there is only a small amount of very dark urine. °· You have abdominal pain that increases or localizes. °· You have weakness, dizziness, confusion, or light-headedness. °· You have a severe headache. °· Your diarrhea gets worse or does not get better. °· You have a fever or persistent symptoms for more than 2-3 days. °· You have a fever and your symptoms suddenly get worse. °MAKE SURE YOU:  °· Understand these instructions. °· Will watch your condition. °· Will get help right away if you are not doing well or get worse. °  °This information is not intended to replace advice given to you by your health care provider. Make sure you   discuss any questions you have with your health care provider. °  °Document Released: 12/03/2002 Document Revised: 01/03/2015 Document Reviewed: 08/20/2012 °Elsevier Interactive Patient Education ©2016 [employer]. ° °Ondansetron tablets °What is this medicine? °ONDANSETRON (on DAN se tron) is used to treat nausea and vomiting caused by chemotherapy. It is also used to prevent or treat nausea and vomiting after surgery. °This medicine may be used for other purposes; ask your health care provider or pharmacist if  you have questions. °What should I tell my health care provider before I take this medicine? °They need to know if you have any of these conditions: °-heart disease °-history of irregular heartbeat °-liver disease °-low levels of magnesium or potassium in the blood °-an unusual or allergic reaction to ondansetron, granisetron, other medicines, foods, dyes, or preservatives °-pregnant or trying to get pregnant °-breast-feeding °How should I use this medicine? °Take this medicine by mouth with a glass of water. Follow the directions on your prescription label. Take your doses at regular intervals. Do not take your medicine more often than directed. °Talk to your pediatrician regarding the use of this medicine in children. Special care may be needed. °Overdosage: If you think you have taken too much of this medicine contact a poison control center or emergency room at once. °NOTE: This medicine is only for you. Do not share this medicine with others. °What if I miss a dose? °If you miss a dose, take it as soon as you can. If it is almost time for your next dose, take only that dose. Do not take double or extra doses. °What may interact with this medicine? °Do not take this medicine with any of the following medications: °-apomorphine °-certain medicines for fungal infections like fluconazole, itraconazole, ketoconazole, posaconazole, voriconazole °-cisapride °-dofetilide °-dronedarone °-pimozide °-thioridazine °-ziprasidone °This medicine may also interact with the following medications: °-carbamazepine °-certain medicines for depression, anxiety, or psychotic disturbances °-fentanyl °-linezolid °-MAOIs like Carbex, Eldepryl, Marplan, Nardil, and Parnate °-methylene blue (injected into a vein) °-other medicines that prolong the QT interval (cause an abnormal heart rhythm) °-phenytoin °-rifampicin °-tramadol °This list may not describe all possible interactions. Give your health care provider a list of all the  medicines, herbs, non-prescription drugs, or dietary supplements you use. Also tell them if you smoke, drink alcohol, or use illegal drugs. Some items may interact with your medicine. °What should I watch for while using this medicine? °Check with your doctor or health care professional right away if you have any sign of an allergic reaction. °What side effects may I notice from receiving this medicine? °Side effects that you should report to your doctor or health care professional as soon as possible: °-allergic reactions like skin rash, itching or hives, swelling of the face, lips or tongue °-breathing problems °-confusion °-dizziness °-fast or irregular heartbeat °-feeling faint or lightheaded, falls °-fever and chills °-loss of balance or coordination °-seizures °-sweating °-swelling of the hands or feet °-tightness in the chest °-tremors °-unusually weak or tired °Side effects that usually do not require medical attention (report to your doctor or health care professional if they continue or are bothersome): °-constipation or diarrhea °-headache °This list may not describe all possible side effects. Call your doctor for medical advice about side effects. You may report side effects to FDA at 1-800-FDA-1088. °Where should I keep my medicine? °Keep out of the reach of children. °Store between 2 and 30 degrees C (36 and 86 degrees F). Throw away any unused medicine after the expiration   date. °NOTE: This sheet is a summary. It may not cover all possible information. If you have questions about this medicine, talk to your doctor, pharmacist, or health care provider. °  °© 2016, Elsevier[employer]. (2013-09-19 16:27:45) ° °

## 2015-12-17 NOTE — ED Notes (Signed)
Pt c/o abdominal pain x couple of days and states tonight he noticed blood in his vomit

## 2016-05-02 ENCOUNTER — Encounter (HOSPITAL_COMMUNITY): Payer: Self-pay | Admitting: Emergency Medicine

## 2016-05-02 ENCOUNTER — Emergency Department (HOSPITAL_COMMUNITY)
Admission: EM | Admit: 2016-05-02 | Discharge: 2016-05-02 | Disposition: A | Discharge status: Home / Self Care | Payer: Medicaid Other | Attending: Emergency Medicine | Admitting: Emergency Medicine

## 2016-05-02 DIAGNOSIS — K6289 Other specified diseases of anus and rectum: Secondary | ICD-10-CM | POA: Insufficient documentation

## 2016-05-02 DIAGNOSIS — K648 Other hemorrhoids: Secondary | ICD-10-CM

## 2016-05-02 MED ORDER — HYDROCODONE-ACETAMINOPHEN 5-325 MG PO TABS: 2.0000 | ORAL_TABLET | ORAL | Status: DC | PRN

## 2016-05-02 MED ORDER — DOCUSATE SODIUM 100 MG PO CAPS: 100.0000 mg | ORAL_CAPSULE | Freq: Two times a day (BID) | ORAL | Status: DC

## 2016-05-02 MED ORDER — HYDROCORTISONE 2.5 % RE CREA: TOPICAL_CREAM | RECTAL | Status: AC

## 2016-05-02 NOTE — Discharge Instructions (Signed)
Hemorrhoids °Hemorrhoids are swollen veins around the rectum or anus. There are two types of hemorrhoids:  °· Internal hemorrhoids. These occur in the veins just inside the rectum. They may poke through to the outside and become irritated and painful. °· External hemorrhoids. These occur in the veins outside the anus and can be felt as a painful swelling or hard lump near the anus. °CAUSES °· Pregnancy.   °· Obesity.   °· Constipation or diarrhea.   °· Straining to have a bowel movement.   °· Sitting for long periods on the toilet. °· Heavy lifting or other activity that caused you to strain. °· Anal intercourse. °SYMPTOMS  °· Pain.   °· Anal itching or irritation.   °· Rectal bleeding.   °· Fecal leakage.   °· Anal swelling.   °· One or more lumps around the anus.   °DIAGNOSIS  °Your caregiver may be able to diagnose hemorrhoids by visual examination. Other examinations or tests that may be performed include:  °· Examination of the rectal area with a gloved hand (digital rectal exam).   °· Examination of anal canal using a small tube (scope).   °· A blood test if you have lost a significant amount of blood. °· A test to look inside the colon (sigmoidoscopy or colonoscopy). °TREATMENT °Most hemorrhoids can be treated at home. However, if symptoms do not seem to be getting better or if you have a lot of rectal bleeding, your caregiver may perform a procedure to help make the hemorrhoids get smaller or remove them completely. Possible treatments include:  °· Placing a rubber band at the base of the hemorrhoid to cut off the circulation (rubber band ligation).   °· Injecting a chemical to shrink the hemorrhoid (sclerotherapy).   °· Using a tool to burn the hemorrhoid (infrared light therapy).   °· Surgically removing the hemorrhoid (hemorrhoidectomy).   °· Stapling the hemorrhoid to block blood flow to the tissue (hemorrhoid stapling).   °HOME CARE INSTRUCTIONS  °· Eat foods with fiber, such as whole grains, beans,  nuts, fruits, and vegetables. Ask your doctor about taking products with added fiber in them (fiber supplements). °· Increase fluid intake. Drink enough water and fluids to keep your urine clear or pale yellow.   °· Exercise regularly.   °· Go to the bathroom when you have the urge to have a bowel movement. Do not wait.   °· Avoid straining to have bowel movements.   °· Keep the anal area dry and clean. Use wet toilet paper or moist towelettes after a bowel movement.   °· Medicated creams and suppositories may be used or applied as directed.   °· Only take over-the-counter or prescription medicines as directed by your caregiver.   °· Take warm sitz baths for 15-20 minutes, 3-4 times a day to ease pain and discomfort.   °· Place ice packs on the hemorrhoids if they are tender and swollen. Using ice packs between sitz baths may be helpful.   °¨ Put ice in a plastic bag.   °¨ Place a towel between your skin and the bag.   °¨ Leave the ice on for 15-20 minutes, 3-4 times a day.   °· Do not use a donut-shaped pillow or sit on the toilet for long periods. This increases blood pooling and pain.   °SEEK MEDICAL CARE IF: °· You have increasing pain and swelling that is not controlled by treatment or medicine. °· You have uncontrolled bleeding. °· You have difficulty or you are unable to have a bowel movement. °· You have pain or inflammation outside the area of the hemorrhoids. °MAKE SURE YOU: °· Understand these instructions. °·   Will watch your condition. °· Will get help right away if you are not doing well or get worse. °  °This information is not intended to replace advice given to you by your health care provider. Make sure you discuss any questions you have with your health care provider. °  °Document Released: 12/10/2000 Document Revised: 11/29/2012 Document Reviewed: 10/17/2012 °Elsevier Interactive Patient Education ©2016 [employer] Inc. ° °High-Fiber Diet °Fiber, also called dietary fiber, is a type of carbohydrate  found in fruits, vegetables, whole grains, and beans. A high-fiber diet can have many health benefits. Your health care provider may recommend a high-fiber diet to help: °· Prevent constipation. Fiber can make your bowel movements more regular. °· Lower your cholesterol. °· Relieve hemorrhoids, uncomplicated diverticulosis, or irritable bowel syndrome. °· Prevent overeating as part of a weight-loss plan. °· Prevent heart disease, type 2 diabetes, and certain cancers. °WHAT IS MY PLAN? °The recommended daily intake of fiber includes: °· 38 grams for men under age 50. °· 30 grams for men over age 50. °· 25 grams for women under age 50. °· 21 grams for women over age 50. °You can get the recommended daily intake of dietary fiber by eating a variety of fruits, vegetables, grains, and beans. Your health care provider may also recommend a fiber supplement if it is not possible to get enough fiber through your diet. °WHAT DO I NEED TO KNOW ABOUT A HIGH-FIBER DIET? °· Fiber supplements have not been widely studied for their effectiveness, so it is better to get fiber through food sources. °· Always check the fiber content on the nutrition facts label of any prepackaged food. Look for foods that contain at least 5 grams of fiber per serving. °· Ask your dietitian if you have questions about specific foods that are related to your condition, especially if those foods are not listed in the following section. °· Increase your daily fiber consumption gradually. Increasing your intake of dietary fiber too quickly may cause bloating, cramping, or gas. °· Drink plenty of water. Water helps you to digest fiber. °WHAT FOODS CAN I EAT? °Grains °Whole-grain breads. Multigrain cereal. Oats and oatmeal. Brown rice. Barley. Bulgur wheat. Millet. Bran muffins. Popcorn. Rye wafer crackers. °Vegetables °Sweet potatoes. Spinach. Kale. Artichokes. Cabbage. Broccoli. Green peas. Carrots. Squash. °Fruits °Berries. Pears. Apples. Oranges.  Avocados. Prunes and raisins. Dried figs. °Meats and Other Protein Sources °Navy, kidney, pinto, and soy beans. Split peas. Lentils. Nuts and seeds. °Dairy °Fiber-fortified yogurt. °Beverages °Fiber-fortified soy milk. Fiber-fortified orange juice. °Other °Fiber bars. °The items listed above may not be a complete list of recommended foods or beverages. Contact your dietitian for more options. °WHAT FOODS ARE NOT RECOMMENDED? °Grains °White bread. Pasta made with refined flour. White rice. °Vegetables °Fried potatoes. Canned vegetables. Well-cooked vegetables.  °Fruits °Fruit juice. Cooked, strained fruit. °Meats and Other Protein Sources °Fatty cuts of meat. Fried poultry or fried fish. °Dairy °Milk. Yogurt. Cream cheese. Sour cream. °Beverages °Soft drinks. °Other °Cakes and pastries. Butter and oils. °The items listed above may not be a complete list of foods and beverages to avoid. Contact your dietitian for more information. °WHAT ARE SOME TIPS FOR INCLUDING HIGH-FIBER FOODS IN MY DIET? °· Eat a wide variety of high-fiber foods. °· Make sure that half of all grains consumed each day are whole grains. °· Replace breads and cereals made from refined flour or white flour with whole-grain breads and cereals. °· Replace white rice with brown rice, bulgur wheat, or millet. °· Start   the day with a breakfast that is high in fiber, such as a cereal that contains at least 5 grams of fiber per serving. °· Use beans in place of meat in soups, salads, or pasta. °· Eat high-fiber snacks, such as berries, raw vegetables, nuts, or popcorn. °  °This information is not intended to replace advice given to you by your health care provider. Make sure you discuss any questions you have with your health care provider. °  °Document Released: 12/13/2005 Document Revised: 01/03/2015 Document Reviewed: 05/28/2014 °Elsevier Interactive Patient Education ©2016 Elsevier Inc. ° °

## 2016-05-02 NOTE — ED Provider Notes (Signed)
[name]: [id number]     Arrival date & time 05/02/16  0844 History   First MD Initiated Contact with Patient 05/02/16 [phone]     Chief Complaint  Patient presents with  . Rectal Pain     (Consider location/radiation/quality/duration/timing/severity/associated sxs/prior Treatment) HPI Comments: Pt complains of hemorrhoids.  Pt reports he lifted a heavy game box at work last week and has had pain and hemorrhoid swelling since.  Pt reports discomfort with walking.  Pain with BM.  Pt denies straining. No blood, no black or tarry stool  The history is provided by the patient. No language interpreter was used.    History reviewed. No pertinent past medical history. Past Surgical History  Procedure Laterality Date  . Amputation      right ring fingertip  . Orif finger fracture  09/06/2012    Procedure: OPEN REDUCTION INTERNAL FIXATION (ORIF) METACARPAL (FINGER) FRACTURE;  Surgeon: Darreld McleanWayne Keeling, MD;  Location: [facility];  Service: Orthopedics;  Laterality: Right;   History reviewed. No pertinent family history. Social History  Substance Use Topics  . Smoking status: Never Smoker   . Smokeless tobacco: Never Used  . Alcohol Use: Yes     Comment: occassional    Review of Systems  Gastrointestinal: Positive for rectal pain.  All other systems reviewed and are negative.     Allergies  Bee venom  Home Medications   Prior to Admission medications   Medication Sig Start Date End Date Taking? Authorizing Provider  docusate sodium (COLACE) 100 MG capsule Take 1 capsule (100 mg total) by mouth every 12 (twelve) hours. 12/01/15   Rolland PorterMark Maddex, MD  EPINEPHrine (EPI-PEN) 0.3 mg/0.3 mL DEVI Inject 0.3 mg into the muscle as needed. Allergic Reaction 08/16/12   Samuel JesterKathleen McManus, DO  HYDROcodone-acetaminophen (NORCO/VICODIN) 5-325 MG tablet Take 2 tablets by mouth every 4 (four) hours as needed. 12/01/15   Rolland PorterMark Saurabh, MD  hydrocortisone (ANUSOL-HC) 2.5 % rectal cream Apply rectally 2 times daily 12/01/15    Rolland PorterMark Dat, MD  ondansetron [facility](ZOFRAN) 4 MG tablet Take 1 tablet (4 mg total) by mouth every 8 (eight) hours as needed for nausea or vomiting. 12/17/15   Dione Boozeavid Glick, MD   BP 127/94 mmHg  Pulse 64  Temp(Src) 97.7 F (36.5 C) (Oral)  Resp 18  Ht 5\' 6"  (1.676 m)  Wt 90.719 kg  BMI 32.30 kg/m2  SpO2 98% Physical Exam  Constitutional: He is oriented to person, place, and time. He appears well-developed and well-nourished.  HENT:  Head: Normocephalic.  Right Ear: External ear normal.  Mouth/Throat: Oropharynx is clear and moist.  Eyes: Conjunctivae are normal. Pupils are equal, round, and reactive to light.  Neck: Normal range of motion.  Cardiovascular: Normal rate.   Pulmonary/Chest: Effort normal.  Musculoskeletal: Normal range of motion.  Neurological: He is alert and oriented to person, place, and time. He has normal reflexes.  Skin: Skin is warm.  Psychiatric: He has a normal mood and affect.  Nursing note and vitals reviewed.   ED Course  Procedures (including critical care time) Labs Review Labs Reviewed - No data to display  Imaging Review No results found. I have personally reviewed and evaluated these images and lab results as part of my medical decision-making.   EKG Interpretation None      MDM   Final diagnoses:  Hemorrhoid prolapse    Meds ordered this encounter  Medications  . docusate sodium (COLACE) 100 MG capsule    Sig: Take 1 capsule (100  mg total) by mouth every 12 (twelve) hours.    Dispense:  60 capsule    Refill:  0    Order Specific Question:  Supervising Provider    Answer:  MILLER, BRIAN [[id number]]  . HYDROcodone-acetaminophen (NORCO/VICODIN) 5-325 MG tablet    Sig: Take 2 tablets by mouth every 4 (four) hours as needed.    Dispense:  10 tablet    Refill:  0    Order Specific Question:  Supervising Provider    Answer:  Hyacinth Meeker, BRIAN [[id number]]  . hydrocortisone (ANUSOL-HC) 2.5 % rectal cream    Sig: Apply rectally 2 times daily     Dispense:  60 g    Refill:  1    Order Specific Question:  Supervising Provider    Answer:  Eber Hong [[id number]]  An After Visit Summary was printed and given to the patient. Follow up with Dr. Lovell Sheehan if hemorrhoids continue to cause a problem   Elson Areas, PA-C 05/02/16 1058  Nelva Nay, MD 05/02/16 [phone]

## 2016-05-02 NOTE — ED Notes (Signed)
Patient c/o rectal pain after lifting heavy "game box." Denies any problems with BM or straining to have BM. Denies noting any blood in stool.

## 2016-05-02 NOTE — ED Notes (Signed)
Patient assessed by PA. Witnesses present-wife and myself. External hemorrhoids noted.

## 2018-07-02 ENCOUNTER — Emergency Department (HOSPITAL_COMMUNITY): Payer: PRIVATE HEALTH INSURANCE

## 2018-07-02 ENCOUNTER — Encounter (HOSPITAL_COMMUNITY): Payer: Self-pay | Admitting: Emergency Medicine

## 2018-07-02 ENCOUNTER — Emergency Department (HOSPITAL_COMMUNITY)
Admission: EM | Admit: 2018-07-02 | Discharge: 2018-07-02 | Disposition: A | Discharge status: Home / Self Care | Payer: PRIVATE HEALTH INSURANCE | Attending: Emergency Medicine | Admitting: Emergency Medicine

## 2018-07-02 ENCOUNTER — Other Ambulatory Visit: Payer: Self-pay

## 2018-07-02 DIAGNOSIS — Y92838 Other recreation area as the place of occurrence of the external cause: Secondary | ICD-10-CM | POA: Diagnosis not present

## 2018-07-02 DIAGNOSIS — Y999 Unspecified external cause status: Secondary | ICD-10-CM | POA: Diagnosis not present

## 2018-07-02 DIAGNOSIS — Y9389 Activity, other specified: Secondary | ICD-10-CM | POA: Insufficient documentation

## 2018-07-02 DIAGNOSIS — Z79899 Other long term (current) drug therapy: Secondary | ICD-10-CM | POA: Insufficient documentation

## 2018-07-02 DIAGNOSIS — S20211A Contusion of right front wall of thorax, initial encounter: Secondary | ICD-10-CM

## 2018-07-02 DIAGNOSIS — S299XXA Unspecified injury of thorax, initial encounter: Secondary | ICD-10-CM | POA: Diagnosis present

## 2018-07-02 MED ORDER — CYCLOBENZAPRINE HCL 10 MG PO TABS
10.0000 mg | ORAL_TABLET | Freq: Once | ORAL | Status: AC
  Administered 2018-07-02: 10 mg via ORAL
  Filled 2018-07-02: qty 1

## 2018-07-02 MED ORDER — DICLOFENAC SODIUM 50 MG PO TBEC: 50.0000 mg | DELAYED_RELEASE_TABLET | Freq: Two times a day (BID) | ORAL | 0 refills | Status: AC

## 2018-07-02 MED ORDER — HYDROCODONE-ACETAMINOPHEN 5-325 MG PO TABS
1.0000 | ORAL_TABLET | Freq: Once | ORAL | Status: AC
  Administered 2018-07-02: 1 via ORAL
  Filled 2018-07-02: qty 1

## 2018-07-02 MED ORDER — CYCLOBENZAPRINE HCL 10 MG PO TABS: 10.0000 mg | ORAL_TABLET | Freq: Two times a day (BID) | ORAL | 0 refills | Status: AC | PRN
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
## 2018-07-02 NOTE — Discharge Instructions (Signed)
Do not drive while taking the muscle relaxer as it will make you sleepy. °

## 2018-07-02 NOTE — ED Notes (Signed)
Patient to radiology.

## 2018-07-18 ENCOUNTER — Other Ambulatory Visit: Payer: Self-pay

## 2018-07-18 ENCOUNTER — Encounter (HOSPITAL_COMMUNITY): Payer: Self-pay | Admitting: Emergency Medicine

## 2018-07-18 ENCOUNTER — Emergency Department (HOSPITAL_COMMUNITY)
Admission: EM | Admit: 2018-07-18 | Discharge: 2018-07-18 | Disposition: A | Discharge status: Home / Self Care | Payer: PRIVATE HEALTH INSURANCE | Attending: Emergency Medicine | Admitting: Emergency Medicine

## 2018-07-18 DIAGNOSIS — K5900 Constipation, unspecified: Secondary | ICD-10-CM | POA: Insufficient documentation

## 2018-07-18 DIAGNOSIS — K625 Hemorrhage of anus and rectum: Secondary | ICD-10-CM | POA: Insufficient documentation

## 2018-07-18 HISTORY — DX: Unspecified hemorrhoids: K64.9

## 2018-07-18 MED ORDER — DOCUSATE SODIUM 100 MG PO CAPS: 100.0000 mg | ORAL_CAPSULE | Freq: Two times a day (BID) | ORAL | 0 refills | Status: AC

## 2018-07-18 MED ORDER — HYDROCORTISONE 1 % EX CREA: TOPICAL_CREAM | CUTANEOUS | 0 refills | Status: AC

## 2018-07-18 MED ORDER — POLYETHYLENE GLYCOL 3350 17 G PO PACK: 17.0000 g | PACK | Freq: Every day | ORAL | 0 refills | Status: AC

## 2018-07-18 NOTE — ED Triage Notes (Signed)
Pt c/o bright red blood in stool since yesterday. Hx of hemorrhoids. Denies any abdominal pain.

## 2018-07-18 NOTE — ED Notes (Signed)
EDP at bedside  

## 2018-07-18 NOTE — ED Provider Notes (Signed)
Emergency Department Provider Note   I have reviewed the triage vital signs and the nursing notes.   HISTORY  Chief Complaint Rectal Bleeding   HPI Brad Galvan is a 32 y.o. male with a history of hemorrhoids who presents the emergency department today secondary to blood in his stool.  Patient states he had constipation a couple days ago and then yesterday he noticed he had bright red blood in the water around his stool and also on the toilet paper.  He had this before but then he continued today so he was concerned and brought to the emergency room.  He states is much less today than it was yesterday.  States he is 88 nausea vomiting fever.  Is been tolerating p.o. without any decrease.  No urinary symptoms.  No history of colon cancer or inflammatory bowel disease. No other associated or modifying symptoms.    Past Medical History:  Diagnosis Date  . Hemorrhoids     There are no active problems to display for this patient.   Past Surgical History:  Procedure Laterality Date  . AMPUTATION     right ring fingertip  . ORIF FINGER FRACTURE  09/06/2012   Procedure: OPEN REDUCTION INTERNAL FIXATION (ORIF) METACARPAL (FINGER) FRACTURE;  Surgeon: Darreld McleanWayne Keeling, MD;  Location: [facility];  Service: Orthopedics;  Laterality: Right;    Current Outpatient Rx  . Order #: [id number] Class: Print  . Order #: [id number] Class: Print  . Order #: [id number] Class: Print  . Order #: [id number] Class: Historical Med  . Order #: [id number] Class: Print  . Order #: [id number] Class: Print  . Order #: [id number] Class: Print    Allergies Bee venom  No family history on file.  Social History Social History   Tobacco Use  . Smoking status: Never Smoker  . Smokeless tobacco: Never Used  Substance Use Topics  . Alcohol use: Yes    Comment: occassional  . Drug use: No    Review of Systems  All other systems negative except as documented in the HPI. All pertinent positives and negatives as  reviewed in the HPI. ____________________________________________   PHYSICAL EXAM:  VITAL SIGNS: ED Triage Vitals  Enc Vitals Group     BP 07/18/18 0904 131/82     Pulse Rate 07/18/18 0904 85     Resp 07/18/18 0904 16     Temp 07/18/18 0904 98.1 F (36.7 C)     Temp Source 07/18/18 0904 Oral     SpO2 07/18/18 0904 100 %     Weight 07/18/18 0853 225 lb (102.1 kg)     Height 07/18/18 0853 5\' 6"  (1.676 m)     Head Circumference --      Peak Flow --      Pain Score 07/18/18 0852 3     Pain Loc --      Pain Edu? --      Excl. in GC? --     Constitutional: Alert and oriented. Well appearing and in no acute distress. Eyes: Conjunctivae are normal. PERRL. EOMI. Head: Atraumatic. Nose: No congestion/rhinnorhea. Mouth/Throat: Mucous membranes are moist.  Oropharynx non-erythematous. Neck: No stridor.  No meningeal signs.   Cardiovascular: Normal rate, regular rhythm. Good peripheral circulation. Grossly normal heart sounds.   Respiratory: Normal respiratory effort.  No retractions. Lungs CTAB. Gastrointestinal: Soft and nontender. No distention.  Musculoskeletal: No lower extremity tenderness nor edema. No gross deformities of extremities. GU: Chaperoned by nurse, Trula Orehristina, has two nonthrombosed hemorrhiods, small anal fissure.  Neurologic:  Normal speech and language. No gross focal neurologic deficits are appreciated.  Skin:  Skin is warm, dry and intact. No rash noted.  ____________________________________________   INITIAL IMPRESSION / ASSESSMENT AND PLAN / ED COURSE  Suspect anal fissure as the cause of the patient's symptoms.  No evidence of acute blood loss anemia.  Just sitz bath, Preparation H, and stool softeners and laxatives.  GI/surgery follow-up if severely painful and does not improve.  Return precautions discussed.     Pertinent labs & imaging results that were available during my care of the patient were reviewed by me and considered in my medical decision  making (see chart for details).  ____________________________________________  FINAL CLINICAL IMPRESSION(S) / ED DIAGNOSES  Final diagnoses:  None     MEDICATIONS GIVEN DURING THIS VISIT:  Medications - No data to display   NEW OUTPATIENT MEDICATIONS STARTED DURING THIS VISIT:  New Prescriptions   No medications on file    Note:  This note was prepared with assistance of Dragon voice recognition software. Occasional wrong-word or sound-a-like substitutions may have occurred due to the inherent limitations of voice recognition software.   Naome Brigandi, Barbara Cower, MD 07/18/18 [phone]

## 2019-03-28 IMAGING — DX DG RIBS W/ CHEST 3+V*R*
4 series · 4 of 4 positions shown · non-contrast
Comparison: August 09, 2013

CLINICAL DATA: Pain following fall

EXAM:
RIGHT RIBS AND CHEST - 3+ VIEW

[rib pa obl (1 of 2)]
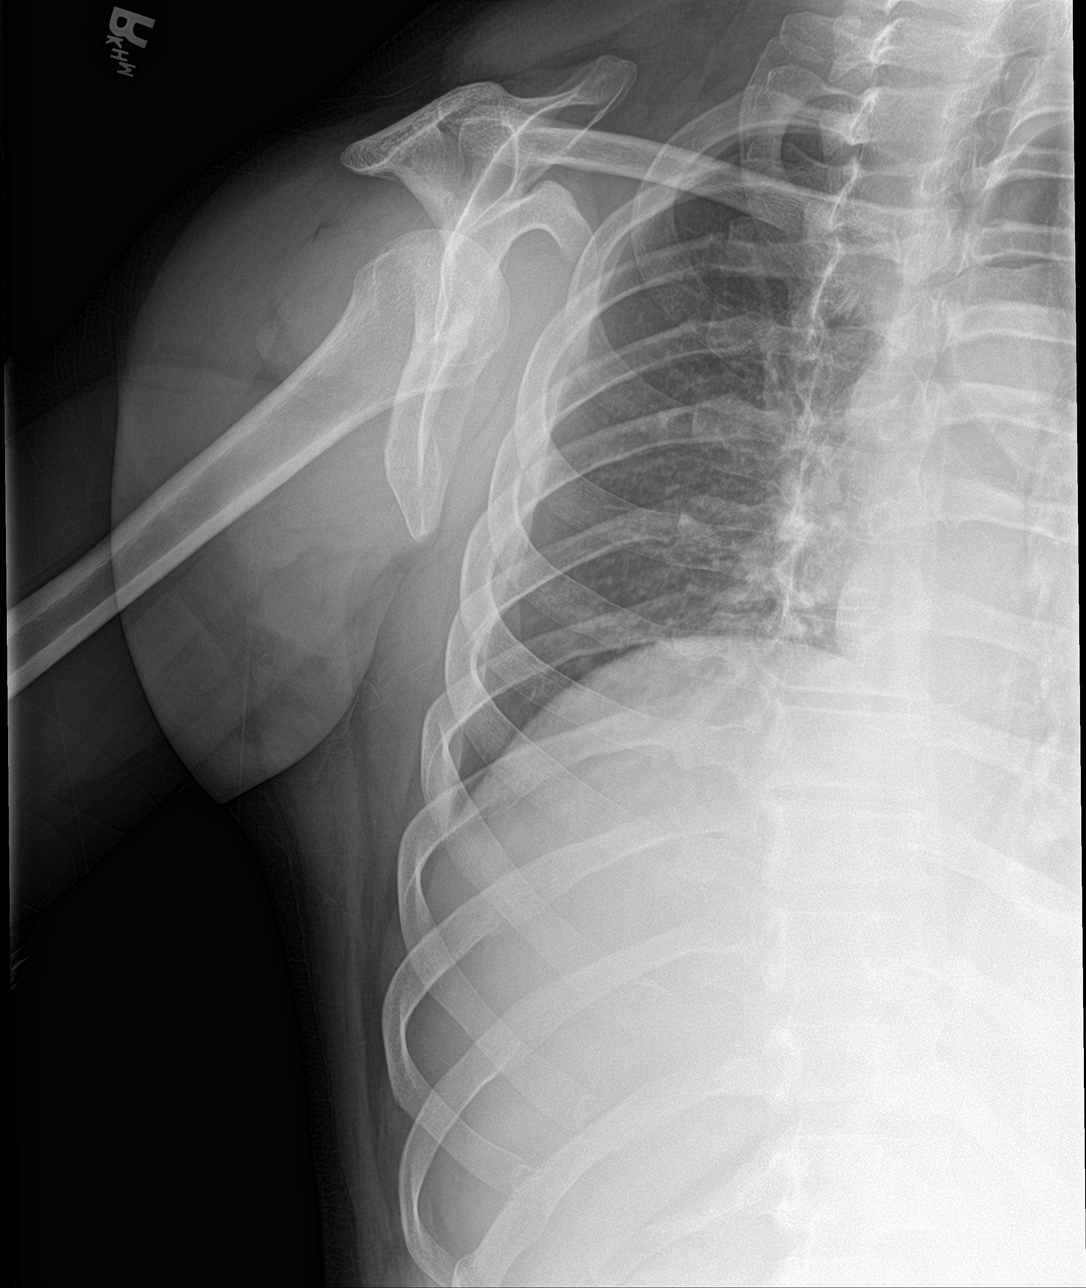

[rib pa obl (2 of 2)]
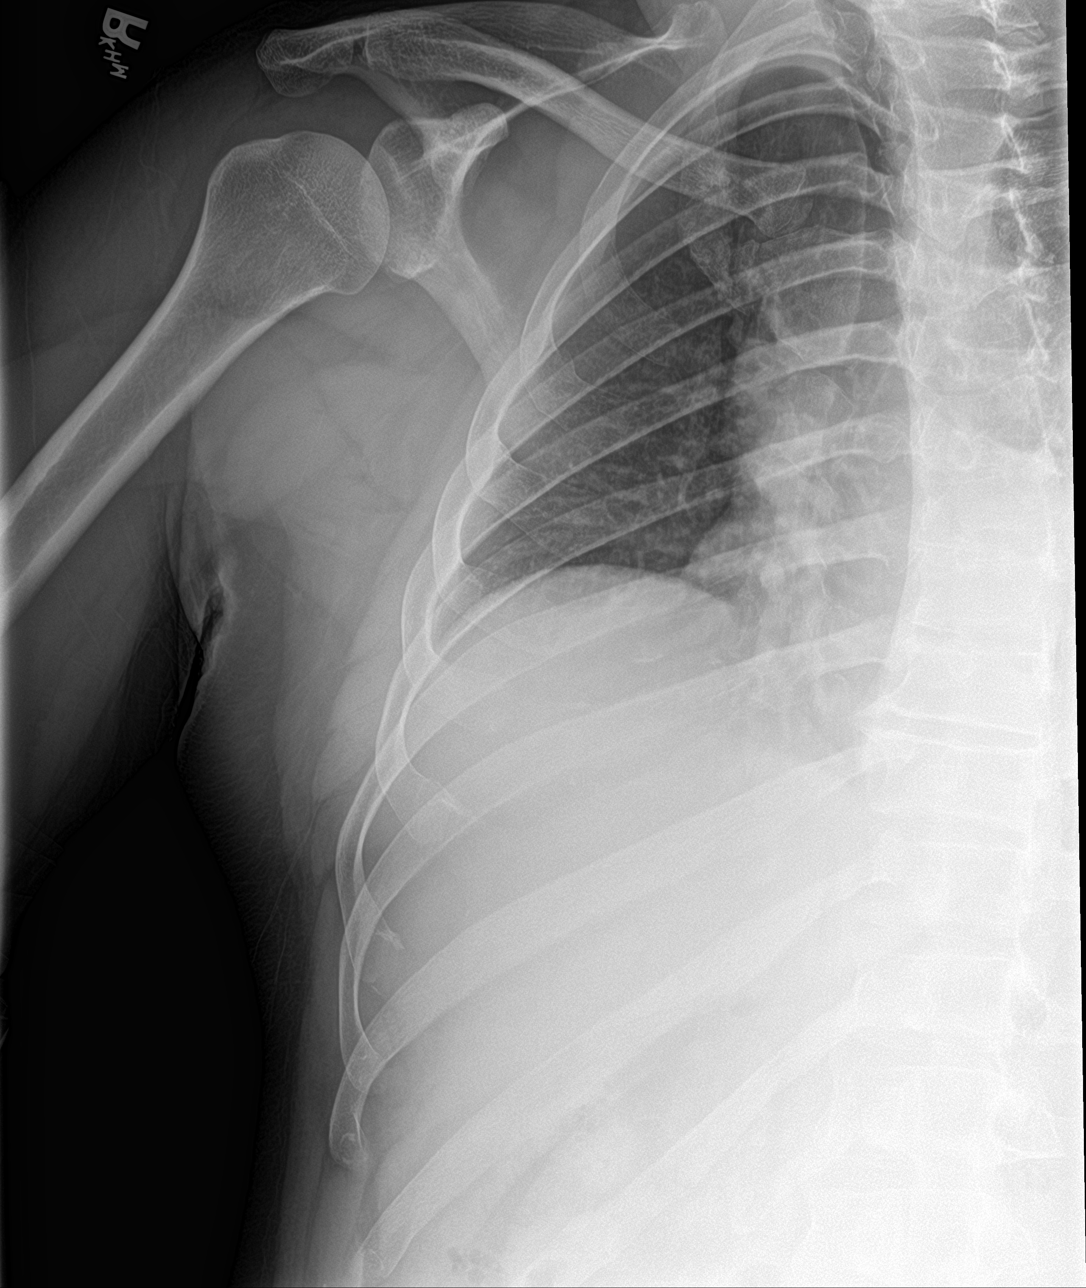

[chest ap]
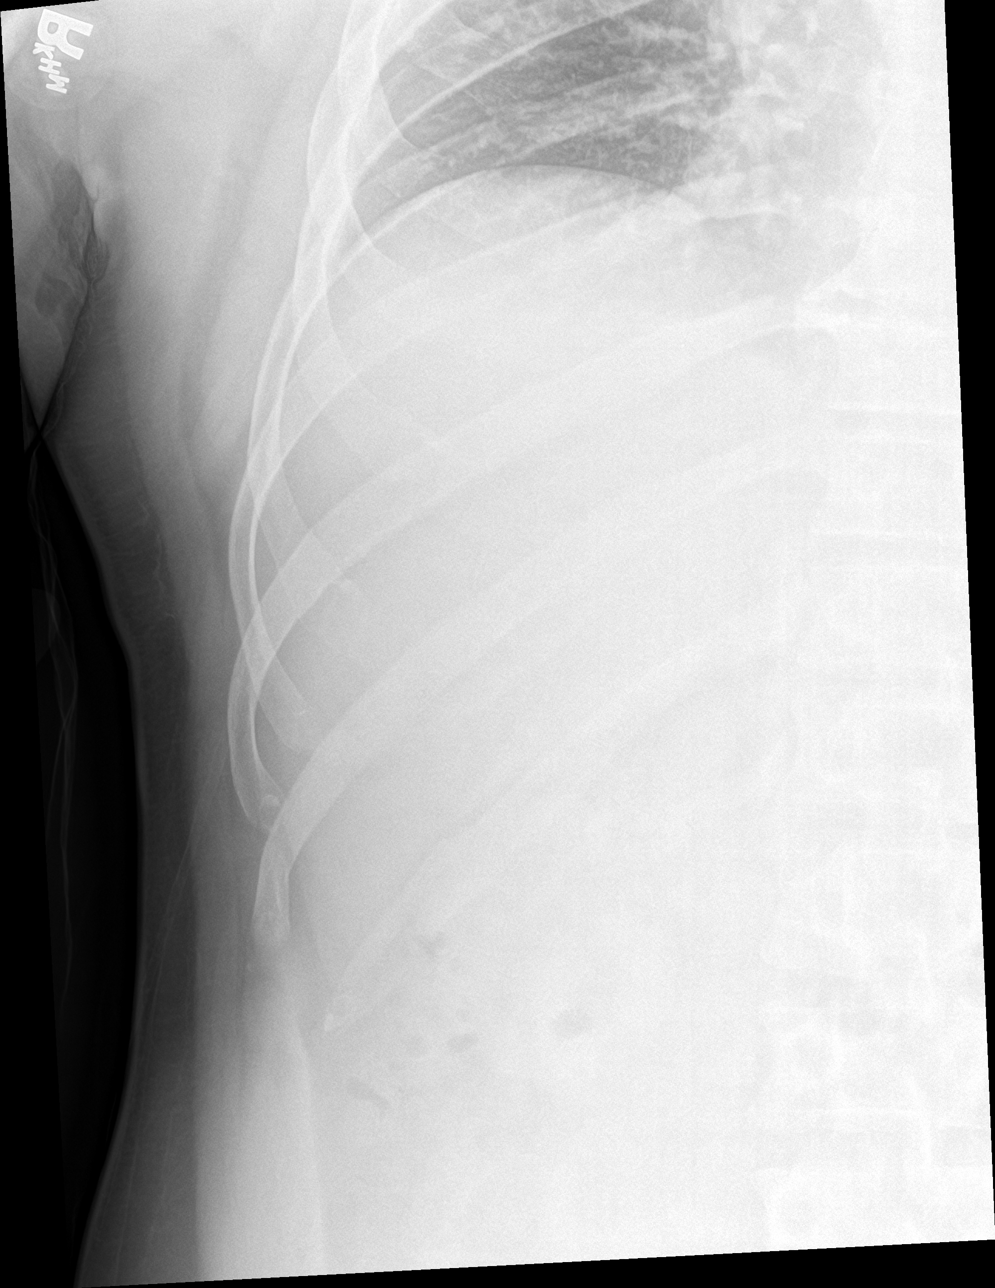

[chest pa]
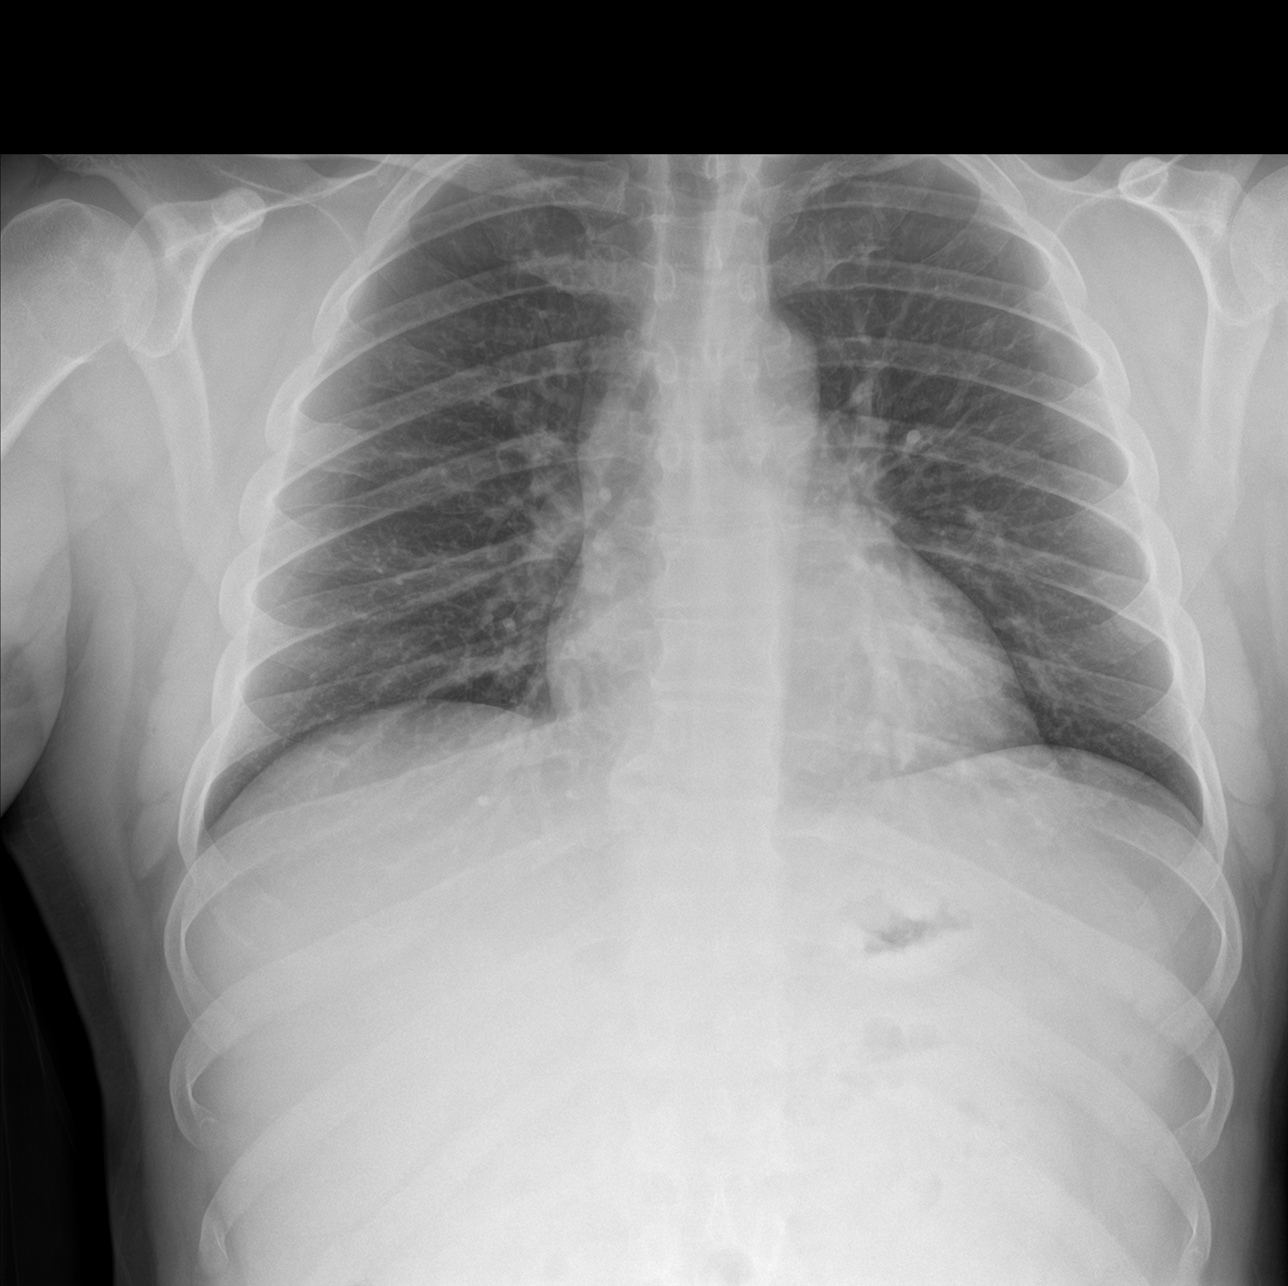

[4 of 4 positions shown; findings below may reference images not displayed]

FINDINGS: Frontal chest as well as oblique and cone-down rib images obtained.
Lungs are clear. Heart size and pulmonary vascularity are normal. No
adenopathy.

No pneumothorax or pleural effusion.  No evident rib fracture.
IMPRESSION: No evident rib fracture.  Lungs clear.

## 2022-12-05 ENCOUNTER — Other Ambulatory Visit: Payer: Self-pay

## 2022-12-05 ENCOUNTER — Emergency Department (HOSPITAL_COMMUNITY): Payer: Self-pay

## 2022-12-05 ENCOUNTER — Emergency Department (HOSPITAL_COMMUNITY)
Admission: EM | Admit: 2022-12-05 | Discharge: 2022-12-05 | Disposition: A | Discharge status: Home / Self Care | Payer: Self-pay | Attending: Emergency Medicine | Admitting: Emergency Medicine

## 2022-12-05 ENCOUNTER — Encounter (HOSPITAL_COMMUNITY): Payer: Self-pay

## 2022-12-05 DIAGNOSIS — M25511 Pain in right shoulder: Secondary | ICD-10-CM | POA: Insufficient documentation

## 2022-12-05 MED ORDER — LIDOCAINE 5 % EX PTCH
1.0000 | MEDICATED_PATCH | CUTANEOUS | Status: DC
  Administered 2022-12-05: 1 via TRANSDERMAL
  Filled 2022-12-05: qty 1

## 2022-12-05 MED ORDER — KETOROLAC TROMETHAMINE 30 MG/ML IJ SOLN
30.0000 mg | Freq: Once | INTRAMUSCULAR | Status: AC
  Administered 2022-12-05: 30 mg via INTRAMUSCULAR
  Filled 2022-12-05: qty 1

## 2022-12-05 MED ORDER — NAPROXEN 500 MG PO TABS: 500.0000 mg | ORAL_TABLET | Freq: Two times a day (BID) | ORAL | 0 refills | Status: AC | PRN

## 2022-12-05 NOTE — Discharge Instructions (Addendum)
Please follow-up with your primary care doctor, and make sure that you are icing the area and using naproxen or Tylenol for pain control.  Do not lift heavy objects, and please follow-up with primary care in regards to possible physical therapy.  [facility] Primary Care Doctor List    Syliva Overman, MD. Specialty: [facility] Medicine Contact information: [address]  [place] [address]  [phone]   Lilyan Punt, MD. Specialty: [facility] Medicine Contact information: [address]  [place] [address]  [phone]   Avon Gully, MD Specialty: Internal Medicine Contact information: [address] [place] [address]  [phone]   Catalina Pizza, MD. Specialty: Internal Medicine Contact information: [address]  [place] [address]  [phone]    [facility] (Dr. Selena Batten) Specialty: Family Medicine Contact information: [address]  [place] [address]  [phone]   John Giovanni, MD. Specialty: [facility] Medicine Contact information: [address]  [address]  [place] [address]  [phone]   Carylon Perches, MD. Specialty: Internal Medicine Contact information: [address]  [address]  [place] [address]  [phone]    [facility]  [address] [place], [place] [address] [phone]  Services [facility] offers a variety of basic health services.  Services include but are not limited to: Blood pressure checks  Heart rate checks  Blood sugar checks  Urine analysis  Rapid strep tests  Pregnancy tests.  Health education and referrals  People needing more complex services will be directed to a physician online. Using these virtual visits, doctors can evaluate and prescribe medicine and treatments. There will be no medication on-site, though [facility] will help patients fill their prescriptions at little to no  cost.   For More information please go to: [email or web address]

## 2022-12-05 NOTE — ED Triage Notes (Signed)
Patient state that he was playing ball a couple weeks ago and fell landing on right shoulder. States that he has had continued pain since then.

## 2022-12-05 NOTE — ED Provider Notes (Signed)
[facility] EMERGENCY DEPARTMENT Provider Note   [name]: [id number] Arrival date & time: 12/05/22  [id number]     History  Chief Complaint  Patient presents with   Shoulder Pain    Brad Galvan is a 36 y.o. male, no pertinent past medical history who presents to the ED secondary to right shoulder pain for the last couple weeks.  He states that he was playing football couple weeks ago, and indirectly landed on his right shoulder.  Since then has had pain with range of motion, and increased weakness of the right arm.  Denies any chest pain or shortness of breath.  States it is worse when he moves his arm, and he feels like he cannot keep it up for long periods of time.  Denies any numbness tingling down the fingers.  Pain is localized to the right inner shoulder.  Denies any bruising of the area.    Home Medications Prior to Admission medications   Medication Sig Start Date End Date Taking? Authorizing Provider  naproxen (NAPROSYN) 500 MG tablet Take 1 tablet (500 mg total) by mouth 2 (two) times daily as needed. 12/05/22  Yes Tyffani Foglesong L, PA  cyclobenzaprine (FLEXERIL) 10 MG tablet Take 1 tablet (10 mg total) by mouth 2 (two) times daily as needed for muscle spasms. 07/02/18   Ashley Murrain, NP  diclofenac (VOLTAREN) 50 MG EC tablet Take 1 tablet (50 mg total) by mouth 2 (two) times daily. 07/02/18   Ashley Murrain, NP  docusate sodium (COLACE) 100 MG capsule Take 1 capsule (100 mg total) by mouth every 12 (twelve) hours. 07/18/18   Mesner, Corene Cornea, MD  docusate sodium (COLACE) 100 MG capsule Take 1 capsule (100 mg total) by mouth every 12 (twelve) hours. 07/18/18   Mesner, Corene Cornea, MD  EPINEPHrine (EPI-PEN) 0.3 mg/0.3 mL DEVI Inject 0.3 mg into the muscle as needed. Allergic Reaction 08/16/12   Francine Graven, DO  HYDROcodone-acetaminophen (NORCO/VICODIN) 5-325 MG tablet Take 2 tablets by mouth every 4 (four) hours as needed. 05/02/16   Fransico Meadow, PA-C  hydrocortisone (ANUSOL-HC) 2.5 % rectal  cream Apply rectally 2 times daily 05/02/16   Fransico Meadow, PA-C  hydrocortisone cream 1 % Apply to affected area 2 times daily 07/18/18   Mesner, Corene Cornea, MD  ondansetron (ZOFRAN) 4 MG tablet Take 1 tablet (4 mg total) by mouth every 8 (eight) hours as needed for nausea or vomiting. [id number]   Delora Fuel, MD  polyethylene glycol [facility] Floria Raveling) packet Take 17 g by mouth daily. 07/18/18   Mesner, Corene Cornea, MD      Allergies    Bee venom    Review of Systems   Review of Systems  Musculoskeletal:  Negative for joint swelling.       +R shoulder pain  Skin:  Negative for color change.  Neurological:  Positive for weakness.    Physical Exam Updated Vital Signs BP (!) 115/94 (BP Location: Left Arm)   Pulse 77   Temp 98 F (36.7 C) (Oral)   Resp 18   Ht 5\' 6"  (1.676 m)   Wt 86.2 kg   SpO2 98%   BMI 30.67 kg/m  Physical Exam Vitals and nursing note reviewed.  Constitutional:      General: He is not in acute distress.    Appearance: He is well-developed.  HENT:     Head: Normocephalic and atraumatic.  Eyes:     General:        Right  eye: No discharge.        Left eye: No discharge.     Conjunctiva/sclera: Conjunctivae normal.  Pulmonary:     Effort: No respiratory distress.  Musculoskeletal:     Comments: Right shoulder: No symmetry, or clavicle tenderness to palpation.  No C-spine tenderness.  Sensation intact.  Decreased strength of her right upper extremity.  4 out of 5, tenderness to palpation of humeral head, and deltoid.  Pain with abduction, internal and external rotation.  No pain with abduction.  No radicular symptoms.  No crepitus or ecchymosis.  Neurological:     Mental Status: He is alert.     Comments: Clear speech.   Psychiatric:        Behavior: Behavior normal.        Thought Content: Thought content normal.     ED Results / Procedures / Treatments   Labs (all labs ordered are listed, but only abnormal results are displayed) Labs Reviewed - No data to  display  EKG None  Radiology DG Shoulder Right  Result Date: 12/05/2022 CLINICAL DATA:  Fall with shoulder injury and persistent pain. EXAM: RIGHT SHOULDER - 2+ VIEW COMPARISON:  None Available. FINDINGS: There is no evidence of fracture or dislocation. Subtle spurring at the inferior glenoid suggest degenerative change. Soft tissues are unremarkable. IMPRESSION: Subtle degenerative changes at the inferior glenoid. No acute bony abnormality. Electronically Signed   By: Kennith Center M.D.   On: 12/05/2022 09:52    Procedures Procedures    Medications Ordered in ED Medications  lidocaine (LIDODERM) 5 % 1 patch (1 patch Transdermal Patch Applied 12/05/22 1015)  ketorolac (TORADOL) 30 MG/ML injection 30 mg (30 mg Intramuscular Given 12/05/22 1015)    ED Course/ Medical Decision Making/ A&P                           Medical Decision Making Patient is a 36 year old male, here for right shoulder pain, fell on his right shoulder a few weeks ago while playing football.  Has had persistent pain, and weakness in the arm ever since.  Denies any chest pain or shortness of breath.  He does have tenderness to humeral head, and tenderness to palpation of the deltoid.  Pain with internal rotation external rotation, and abduction.  Decreased strength in the right upper extremity.  Concern for possible rotator cuff injury, discussed anti-inflammatories, follow-up with primary care, and likely physical therapy.  Amount and/or Complexity of Data Reviewed Radiology: ordered.  Risk Prescription drug management.   Final Clinical Impression(s) / ED Diagnoses Final diagnoses:  Acute pain of right shoulder    Rx / DC Orders ED Discharge Orders          Ordered    naproxen (NAPROSYN) 500 MG tablet  2 times daily PRN        12/05/22 1007              Thara Searing Elbert Ewings, [place] 12/05/22 1016    Pricilla Loveless, MD 12/08/22 [phone]

## 2023-08-13 ENCOUNTER — Emergency Department (HOSPITAL_COMMUNITY): Payer: Self-pay

## 2023-08-13 ENCOUNTER — Emergency Department (HOSPITAL_COMMUNITY)
Admission: EM | Admit: 2023-08-13 | Discharge: 2023-08-13 | Disposition: A | Discharge status: Home / Self Care | Payer: Self-pay | Attending: Emergency Medicine | Admitting: Emergency Medicine

## 2023-08-13 ENCOUNTER — Encounter (HOSPITAL_COMMUNITY): Payer: Self-pay | Admitting: *Deleted

## 2023-08-13 ENCOUNTER — Other Ambulatory Visit: Payer: Self-pay

## 2023-08-13 DIAGNOSIS — S70311A Abrasion, right thigh, initial encounter: Secondary | ICD-10-CM | POA: Insufficient documentation

## 2023-08-13 DIAGNOSIS — S71111A Laceration without foreign body, right thigh, initial encounter: Secondary | ICD-10-CM | POA: Insufficient documentation

## 2023-08-13 DIAGNOSIS — Z23 Encounter for immunization: Secondary | ICD-10-CM | POA: Insufficient documentation

## 2023-08-13 DIAGNOSIS — W268XXA Contact with other sharp object(s), not elsewhere classified, initial encounter: Secondary | ICD-10-CM | POA: Insufficient documentation

## 2023-08-13 MED ORDER — TETANUS-DIPHTH-ACELL PERTUSSIS 5-2.5-18.5 LF-MCG/0.5 IM SUSY
0.5000 mL | PREFILLED_SYRINGE | Freq: Once | INTRAMUSCULAR | Status: AC
  Administered 2023-08-13: 0.5 mL via INTRAMUSCULAR
  Filled 2023-08-13: qty 0.5

## 2023-08-13 MED ORDER — LIDOCAINE-EPINEPHRINE (PF) 2 %-1:200000 IJ SOLN
10.0000 mL | Freq: Once | INTRAMUSCULAR | Status: AC
  Administered 2023-08-13: 10 mL via INTRADERMAL
  Filled 2023-08-13: qty 20

## 2023-08-13 MED ORDER — CEPHALEXIN 500 MG PO CAPS: 500.0000 mg | ORAL_CAPSULE | Freq: Four times a day (QID) | ORAL | 0 refills | Status: AC

## 2023-08-13 MED ORDER — LIDOCAINE-EPINEPHRINE-TETRACAINE (LET) TOPICAL GEL
3.0000 mL | Freq: Once | TOPICAL | Status: AC
  Administered 2023-08-13: 3 mL via TOPICAL
  Filled 2023-08-13: qty 3

## 2023-08-13 NOTE — Discharge Instructions (Signed)
You were seen in the ER today for a laceration to the right thigh. The xray did not show any obvious foreign objects in the wound and I did not visualize any on my exam either. The wound was thoroughly cleaned and closed with nylon stitches.These should remain in place for the next 7-10 days depending on how well the area is healing. If any signs of infection develop, please seek medical evaluation. Signs of infection includes pus draining, severe pain or redness. I have sent in a prescription for an antibiotic for you to begin taking to reduce the risk of infection given that your wound came into contact with the a dirty surface. To have stitches removed, you may return to the ER or seek care with your primary care provider or at an urgent care.
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
## 2023-08-20 ENCOUNTER — Emergency Department (HOSPITAL_COMMUNITY)
Admission: EM | Admit: 2023-08-20 | Discharge: 2023-08-20 | Disposition: A | Discharge status: Home / Self Care | Payer: Self-pay | Attending: Emergency Medicine | Admitting: Emergency Medicine

## 2023-08-20 ENCOUNTER — Other Ambulatory Visit: Payer: Self-pay

## 2023-08-20 DIAGNOSIS — S71111D Laceration without foreign body, right thigh, subsequent encounter: Secondary | ICD-10-CM | POA: Insufficient documentation

## 2023-08-20 DIAGNOSIS — Z4802 Encounter for removal of sutures: Secondary | ICD-10-CM | POA: Insufficient documentation

## 2023-08-20 NOTE — Discharge Instructions (Signed)
Clean and dry as it continues to heal.  We have placed Steri-Strips over top to help support the wound as it continues to heal.  Come back for new or worsening symptoms.  Otherwise follow-up with primary care doctor as needed.

## 2023-08-20 NOTE — ED Notes (Signed)
Needs stitches removed from inner thigh on right leg.
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
## 2023-08-20 NOTE — ED Triage Notes (Signed)
POV/ pt returns to ED for suture removal of right inner upper leg/ 9 sutures in places

## 2023-08-20 NOTE — ED Notes (Signed)
Suture removal completed by PA in VT1.

## 2024-01-24 ENCOUNTER — Emergency Department (HOSPITAL_COMMUNITY)
Admission: EM | Admit: 2024-01-24 | Discharge: 2024-01-24 | Discharge status: Against Medical Advice | Payer: Self-pay | Source: Home / Self Care

## 2024-01-25 ENCOUNTER — Emergency Department (HOSPITAL_COMMUNITY)
Admission: EM | Admit: 2024-01-25 | Discharge: 2024-01-25 | Disposition: A | Discharge status: Home / Self Care | Payer: Self-pay | Attending: Emergency Medicine | Admitting: Emergency Medicine

## 2024-01-25 ENCOUNTER — Other Ambulatory Visit: Payer: Self-pay

## 2024-01-25 ENCOUNTER — Encounter (HOSPITAL_COMMUNITY): Payer: Self-pay

## 2024-01-25 DIAGNOSIS — X58XXXA Exposure to other specified factors, initial encounter: Secondary | ICD-10-CM | POA: Insufficient documentation

## 2024-01-25 DIAGNOSIS — S0501XA Injury of conjunctiva and corneal abrasion without foreign body, right eye, initial encounter: Secondary | ICD-10-CM | POA: Insufficient documentation

## 2024-01-25 MED ORDER — HYDROCODONE-ACETAMINOPHEN 5-325 MG PO TABS: 1.0000 | ORAL_TABLET | Freq: Four times a day (QID) | ORAL | 0 refills | Status: AC | PRN

## 2024-01-25 MED ORDER — TETRACAINE HCL 0.5 % OP SOLN
2.0000 [drp] | Freq: Once | OPHTHALMIC | Status: AC
  Administered 2024-01-25: 2 [drp] via OPHTHALMIC
  Filled 2024-01-25: qty 4

## 2024-01-25 MED ORDER — TOBRAMYCIN 0.3 % OP SOLN
1.0000 [drp] | Freq: Once | OPHTHALMIC | Status: AC
  Administered 2024-01-25: 1 [drp] via OPHTHALMIC
  Filled 2024-01-25: qty 5

## 2024-01-25 MED ORDER — FLUORESCEIN SODIUM 1 MG OP STRP
1.0000 | ORAL_STRIP | Freq: Once | OPHTHALMIC | Status: AC
  Administered 2024-01-25: 1 via OPHTHALMIC
  Filled 2024-01-25: qty 1

## 2024-01-25 NOTE — ED Provider Notes (Signed)
[place] EMERGENCY DEPARTMENT AT [facility] Provider Note   [name]: [id number] Arrival date & time: 01/25/24  2130     History  Chief Complaint  Patient presents with   Foreign Body in Eye    Brad Galvan is a 38 y.o. male with significant past medical history presenting for evaluation of foreign body in his right eye.  He was sawing wood yesterday when a piece of sawdust landed in his right eye, he rubbed the eye after which he flushed the eye but continues to have a foreign body sensation.  He has found no alleviators for symptoms, states any visual changes except that he is photophobic.  He is current with his tetanus status.  The history is provided by the patient.       Home Medications Prior to Admission medications   Medication Sig Start Date End Date Taking? Authorizing Provider  HYDROcodone-acetaminophen (NORCO/VICODIN) 5-325 MG tablet Take 1 tablet by mouth every 6 (six) hours as needed for severe pain (pain score 7-10). 01/25/24  Yes Haylin Camilli, Raynelle Fanning, PA-C  cephALEXin (KEFLEX) 500 MG capsule Take 1 capsule (500 mg total) by mouth 4 (four) times daily. 08/13/23   Smitty Knudsen, PA-C  cyclobenzaprine (FLEXERIL) 10 MG tablet Take 1 tablet (10 mg total) by mouth 2 (two) times daily as needed for muscle spasms. 07/02/18   Janne Napoleon, NP  diclofenac (VOLTAREN) 50 MG EC tablet Take 1 tablet (50 mg total) by mouth 2 (two) times daily. 07/02/18   Janne Napoleon, NP  docusate sodium (COLACE) 100 MG capsule Take 1 capsule (100 mg total) by mouth every 12 (twelve) hours. 07/18/18   Mesner, Barbara Cower, MD  docusate sodium (COLACE) 100 MG capsule Take 1 capsule (100 mg total) by mouth every 12 (twelve) hours. 07/18/18   Mesner, Barbara Cower, MD  EPINEPHrine (EPI-PEN) 0.3 mg/0.3 mL DEVI Inject 0.3 mg into the muscle as needed. Allergic Reaction 08/16/12   Samuel Jester, DO  hydrocortisone (ANUSOL-HC) 2.5 % rectal cream Apply rectally 2 times daily 05/02/16   Elson Areas, PA-C  hydrocortisone  cream 1 % Apply to affected area 2 times daily 07/18/18   Mesner, Barbara Cower, MD  naproxen (NAPROSYN) 500 MG tablet Take 1 tablet (500 mg total) by mouth 2 (two) times daily as needed. 12/05/22   Small, Brooke L, PA  ondansetron (ZOFRAN) 4 MG tablet Take 1 tablet (4 mg total) by mouth every 8 (eight) hours as needed for nausea or vomiting. 12/17/15   [name], MD  polyethylene glycol [facility] / Ethelene Hal) packet Take 17 g by mouth daily. 07/18/18   Mesner, Barbara Cower, MD      Allergies    Bee venom    Review of Systems   Review of Systems  Constitutional:  Negative for fever.  HENT:  Negative for congestion and sore throat.   Eyes:  Positive for photophobia, pain and redness.  Respiratory:  Negative for chest tightness and shortness of breath.   Cardiovascular:  Negative for chest pain.  Gastrointestinal:  Negative for nausea.  Genitourinary: Negative.   Musculoskeletal: Negative.   Skin: Negative.  Negative for rash and wound.  Neurological:  Negative for dizziness.  Psychiatric/Behavioral: Negative.      Physical Exam Updated Vital Signs BP (!) 133/95 (BP Location: Left Arm)   Pulse 63   Temp 97.8 F (36.6 C) (Oral)   Resp 16   Ht 5\' 8"  (1.727 m)   Wt 97.5 kg   SpO2 97%  BMI 32.68 kg/m  Physical Exam Constitutional:      Appearance: He is well-developed.  HENT:     Head: Normocephalic and atraumatic.     Nose: No mucosal edema or rhinorrhea.     Mouth/Throat:     Mouth: Mucous membranes are moist.  Eyes:     General: Lids are normal. Lids are everted, no foreign bodies appreciated.        Right eye: No foreign body.     Extraocular Movements: Extraocular movements intact.     Conjunctiva/sclera:     Right eye: Right conjunctiva is injected.     Pupils:     Right eye: Corneal abrasion and fluorescein uptake present. Seidel exam negative.     Slit lamp exam:    Right eye: Anterior chamber quiet. Photophobia present.     Comments: Visual Acuity Bilateral Near:  20/20 Bilateral Distance: 20/20 R Near: 20/20 R Distance: 20/20 L Near: 20/20 L Distance: 20/20  Patient has a small area of uptake at the 1 o'clock position right eye.  Seidel's is negative.  There is no visible embedded foreign body.  Cardiovascular:     Rate and Rhythm: Normal rate.     Heart sounds: Normal heart sounds.  Pulmonary:     Effort: Pulmonary effort is normal.  Musculoskeletal:        General: Normal range of motion.  Skin:    General: Skin is warm and dry.  Neurological:     Mental Status: He is alert and oriented to person, place, and time.     ED Results / Procedures / Treatments   Labs (all labs ordered are listed, but only abnormal results are displayed) Labs Reviewed - No data to display  EKG None  Radiology No results found.  Procedures Procedures    Medications Ordered in ED Medications  tobramycin (TOBREX) 0.3 % ophthalmic solution 1 drop (has no administration in time range)  fluorescein ophthalmic strip 1 strip (1 strip Both Eyes Given 01/25/24 1234)  tetracaine (PONTOCAINE) 0.5 % ophthalmic solution 2 drop (2 drops Both Eyes Given 01/25/24 1235)    ED Course/ Medical Decision Making/ A&P                                 Medical Decision Making Patient with a small right corneal abrasion.  He is placed on Tobrex, also prescribed some pain medication.  Discussed home treatment and close follow-up with ophthalmology if his symptoms are not significantly improving over the next 2 to 3 days, as needed follow-up with Dr. Allena Katz with referral given.  Patient is current with his tetanus.  Risk Prescription drug management.           Final Clinical Impression(s) / ED Diagnoses Final diagnoses:  Abrasion of right cornea, initial encounter    Rx / DC Orders ED Discharge Orders          Ordered    HYDROcodone-acetaminophen (NORCO/VICODIN) 5-325 MG tablet  Every 6 hours PRN        01/25/24 1315              Burgess Amor,  PA-C 01/25/24 1330    Bethann Berkshire, MD 01/26/24 1013

## 2024-01-25 NOTE — Discharge Instructions (Signed)
Avoid rubbing your eye, you will probably need to wear sunglasses as you will have increased sun sensitivity until this abrasion has healed.  I do not see any foreign body remaining in your eye at this time.  Apply the antibiotic drop 2 drops in your right eye every 4 hours while awake for the next 5 days.  You have been prescribed a pain medication which will make you drowsy, do not drive or operate dangerous equipment within 4 hours of taking this medication.  You may also try taking Motrin during the day as needed.

## 2024-01-25 NOTE — ED Triage Notes (Signed)
Pt arrived via POV c/o right eye pain. Pt reports he was cutting wood yesterday and reports a possible foreign object flying into his eye. Pt reports attempting to irrigate his eye w/o relief. Pts eye presents red and irritated in Triage. Pt denies Loss of vision in his eye.

## 2024-05-05 ENCOUNTER — Other Ambulatory Visit: Payer: Self-pay

## 2024-05-05 ENCOUNTER — Emergency Department (HOSPITAL_COMMUNITY)
Admission: EM | Admit: 2024-05-05 | Discharge: 2024-05-05 | Disposition: A | Discharge status: Home / Self Care | Payer: Self-pay | Attending: Emergency Medicine | Admitting: Emergency Medicine

## 2024-05-05 ENCOUNTER — Encounter (HOSPITAL_COMMUNITY): Payer: Self-pay | Admitting: *Deleted

## 2024-05-05 DIAGNOSIS — F0781 Postconcussional syndrome: Secondary | ICD-10-CM | POA: Insufficient documentation

## 2024-05-05 DIAGNOSIS — Z4802 Encounter for removal of sutures: Secondary | ICD-10-CM | POA: Insufficient documentation
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
